# Patient Record
Sex: Female | Born: 1985 | Race: Black or African American | Hispanic: No | Marital: Single | Smoking: Never smoker
Health system: Southern US, Community
[De-identification: ages and names within clinical notes are randomized; demographics above are authoritative.]

## PROBLEM LIST (undated history)

## (undated) ENCOUNTER — Inpatient Hospital Stay (HOSPITAL_COMMUNITY): Payer: Self-pay

## (undated) DIAGNOSIS — R87619 Unspecified abnormal cytological findings in specimens from cervix uteri: Secondary | ICD-10-CM

## (undated) DIAGNOSIS — IMO0002 Reserved for concepts with insufficient information to code with codable children: Secondary | ICD-10-CM

## (undated) DIAGNOSIS — Z309 Encounter for contraceptive management, unspecified: Secondary | ICD-10-CM

## (undated) DIAGNOSIS — B9689 Other specified bacterial agents as the cause of diseases classified elsewhere: Secondary | ICD-10-CM

## (undated) DIAGNOSIS — O26899 Other specified pregnancy related conditions, unspecified trimester: Secondary | ICD-10-CM

## (undated) DIAGNOSIS — N898 Other specified noninflammatory disorders of vagina: Secondary | ICD-10-CM

## (undated) DIAGNOSIS — Z30432 Encounter for removal of intrauterine contraceptive device: Secondary | ICD-10-CM

## (undated) DIAGNOSIS — N760 Acute vaginitis: Secondary | ICD-10-CM

## (undated) DIAGNOSIS — R87629 Unspecified abnormal cytological findings in specimens from vagina: Secondary | ICD-10-CM

## (undated) DIAGNOSIS — R109 Unspecified abdominal pain: Principal | ICD-10-CM

## (undated) HISTORY — DX: Other specified bacterial agents as the cause of diseases classified elsewhere: N76.0

## (undated) HISTORY — DX: Reserved for concepts with insufficient information to code with codable children: IMO0002

## (undated) HISTORY — DX: Encounter for contraceptive management, unspecified: Z30.9

## (undated) HISTORY — DX: Other specified pregnancy related conditions, unspecified trimester: O26.899

## (undated) HISTORY — DX: Unspecified abnormal cytological findings in specimens from vagina: R87.629

## (undated) HISTORY — PX: NO PAST SURGERIES: SHX2092

## (undated) HISTORY — DX: Unspecified abdominal pain: R10.9

## (undated) HISTORY — DX: Other specified noninflammatory disorders of vagina: N89.8

## (undated) HISTORY — DX: Unspecified abnormal cytological findings in specimens from cervix uteri: R87.619

## (undated) HISTORY — DX: Other specified bacterial agents as the cause of diseases classified elsewhere: B96.89

## (undated) HISTORY — DX: Encounter for removal of intrauterine contraceptive device: Z30.432

---
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
## 2006-10-10 ENCOUNTER — Inpatient Hospital Stay (HOSPITAL_COMMUNITY): Admission: AD | Admit: 2006-10-10 | Discharge: 2006-10-13 | Payer: Self-pay | Admitting: Obstetrics and Gynecology

## 2008-04-27 ENCOUNTER — Other Ambulatory Visit: Admission: RE | Admit: 2008-04-27 | Discharge: 2008-04-27 | Payer: Self-pay | Admitting: Obstetrics and Gynecology

## 2008-05-02 ENCOUNTER — Emergency Department (HOSPITAL_COMMUNITY): Admission: EM | Admit: 2008-05-02 | Discharge: 2008-05-02 | Payer: Self-pay | Admitting: Emergency Medicine

## 2008-09-24 ENCOUNTER — Inpatient Hospital Stay (HOSPITAL_COMMUNITY): Admission: AD | Admit: 2008-09-24 | Discharge: 2008-09-24 | Payer: Self-pay | Admitting: Obstetrics & Gynecology

## 2008-10-20 ENCOUNTER — Inpatient Hospital Stay (HOSPITAL_COMMUNITY): Admission: AD | Admit: 2008-10-20 | Discharge: 2008-10-21 | Payer: Self-pay | Admitting: Obstetrics & Gynecology

## 2008-10-23 ENCOUNTER — Ambulatory Visit: Payer: Self-pay | Admitting: Advanced Practice Midwife

## 2008-10-23 ENCOUNTER — Inpatient Hospital Stay (HOSPITAL_COMMUNITY): Admission: AD | Admit: 2008-10-23 | Discharge: 2008-10-23 | Payer: Self-pay | Admitting: Obstetrics & Gynecology

## 2008-11-10 ENCOUNTER — Inpatient Hospital Stay (HOSPITAL_COMMUNITY): Admission: AD | Admit: 2008-11-10 | Discharge: 2008-11-12 | Payer: Self-pay | Admitting: Obstetrics & Gynecology

## 2008-11-10 ENCOUNTER — Ambulatory Visit: Payer: Self-pay | Admitting: Family

## 2009-09-06 ENCOUNTER — Other Ambulatory Visit: Admission: RE | Admit: 2009-09-06 | Discharge: 2009-09-06 | Payer: Self-pay | Admitting: Unknown Physician Specialty

## 2009-10-17 ENCOUNTER — Emergency Department (HOSPITAL_COMMUNITY): Admission: EM | Admit: 2009-10-17 | Discharge: 2009-10-17 | Payer: Self-pay | Admitting: Emergency Medicine

## 2010-04-12 IMAGING — US US OB TRANSVAGINAL
1 series · 14 of 25 positions shown · non-contrast
Comparison: none

OBSTETRICAL ULTRASOUND:
 This ultrasound exam was performed in the [HOSPITAL] Ultrasound Department.  The OB US report was generated in the AS system, and faxed to the ordering physician.  This report is also available in [REDACTED] PACS.

[Series 1: us ob transvaginal · 0.28mm/px · 14 of 25 slices shown]
[im 1/25]
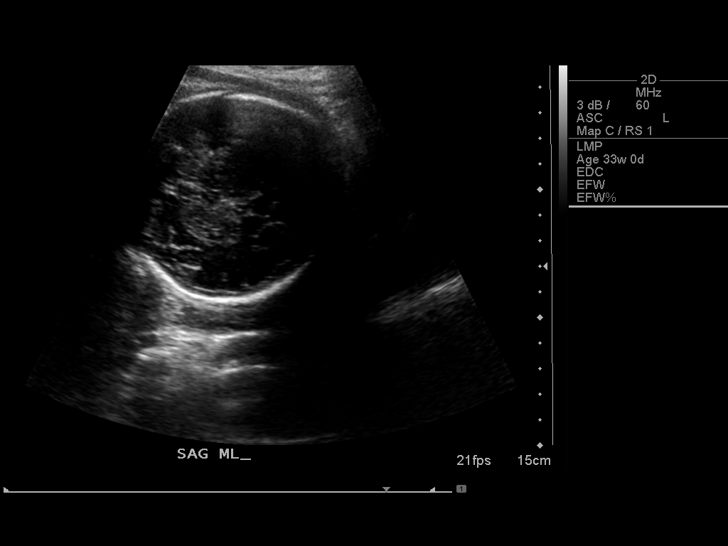
[im 3/25]
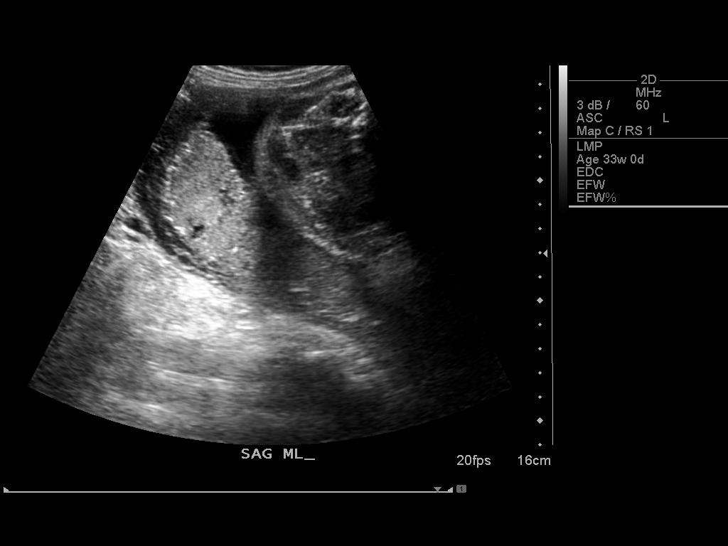
[im 5/25]
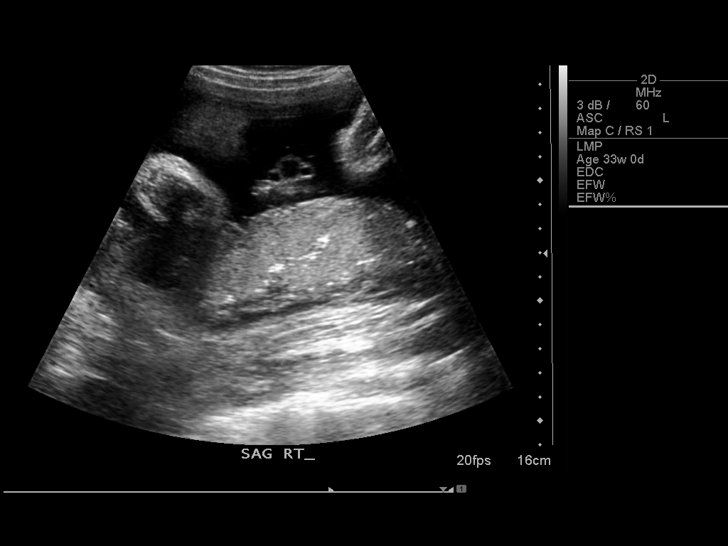
[im 7/25]
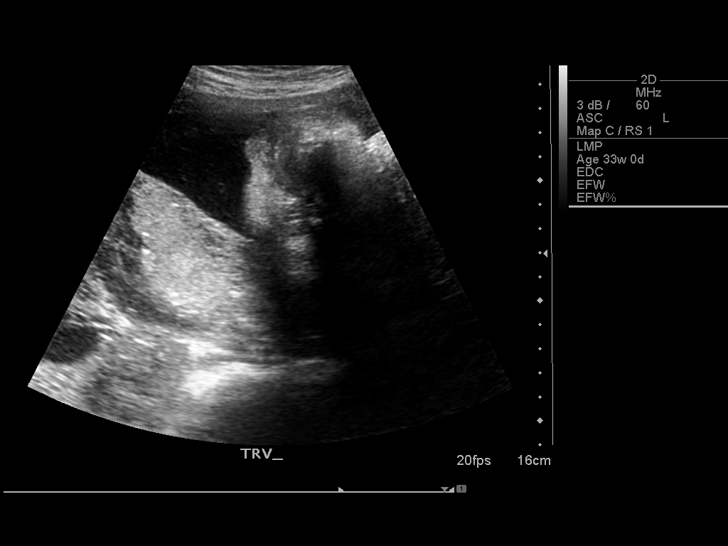
[im 9/25]
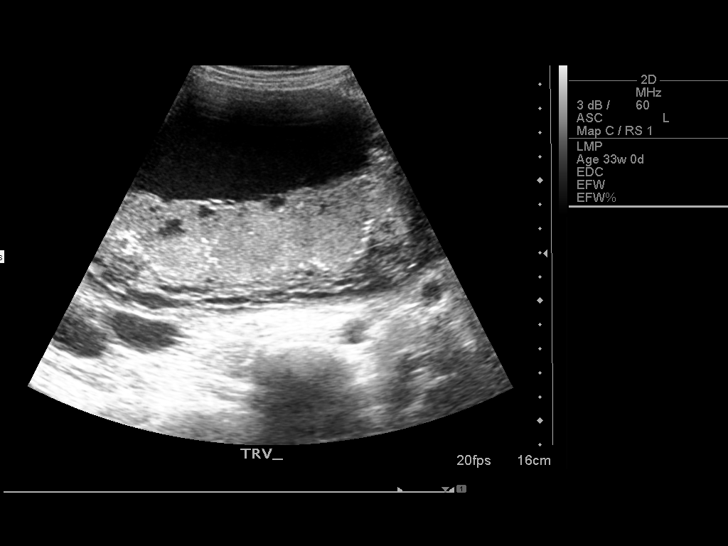
[im 10/25]
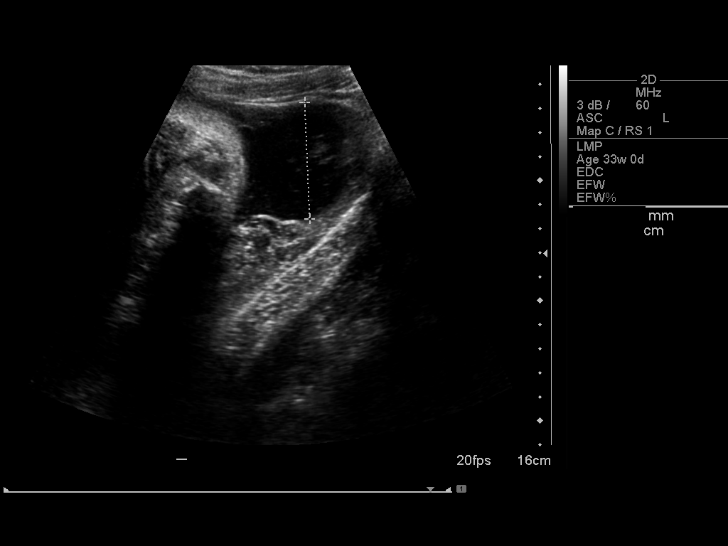
[im 12/25]
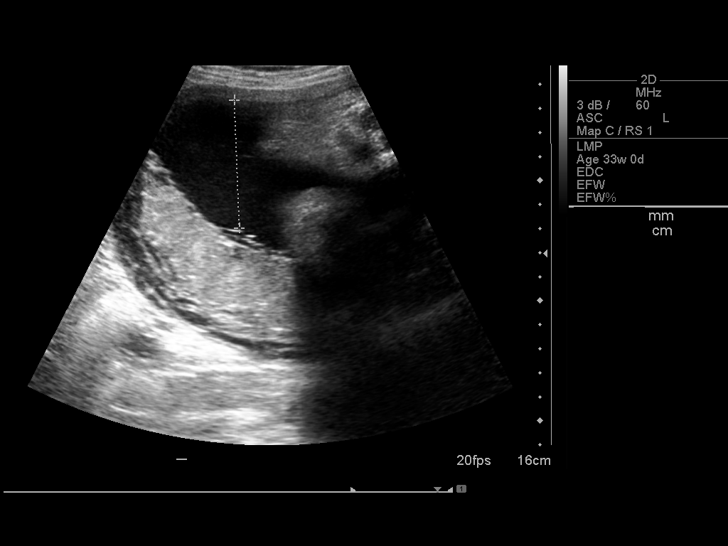
[im 14/25]
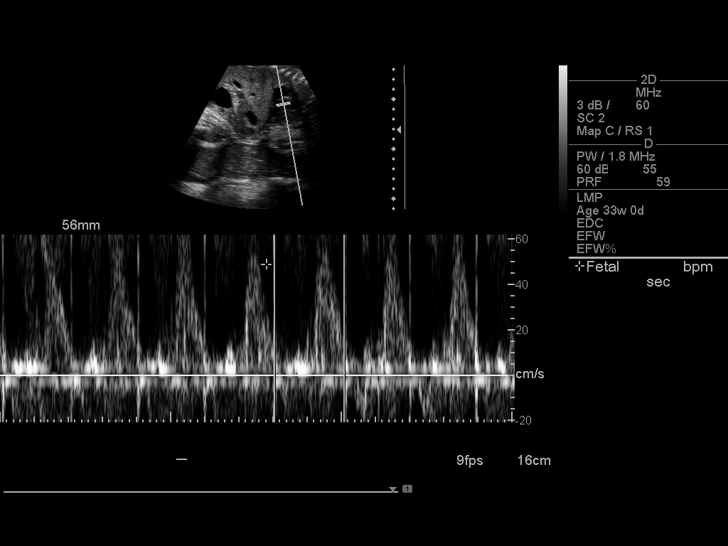
[im 16/25]
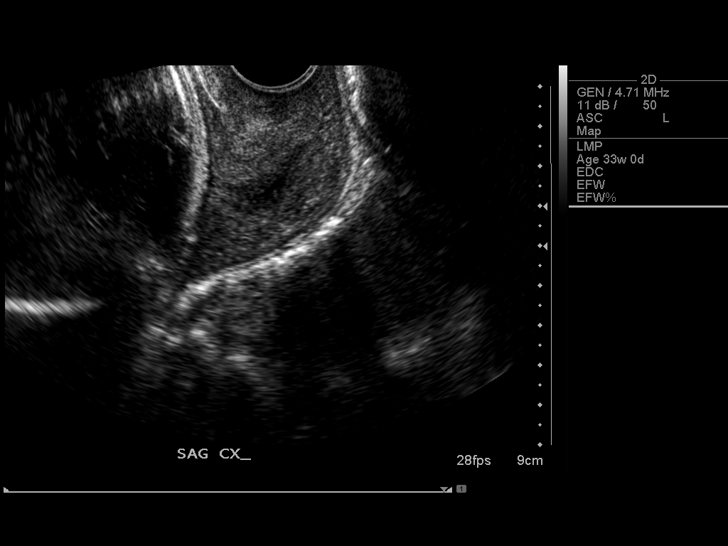
[im 17/25]
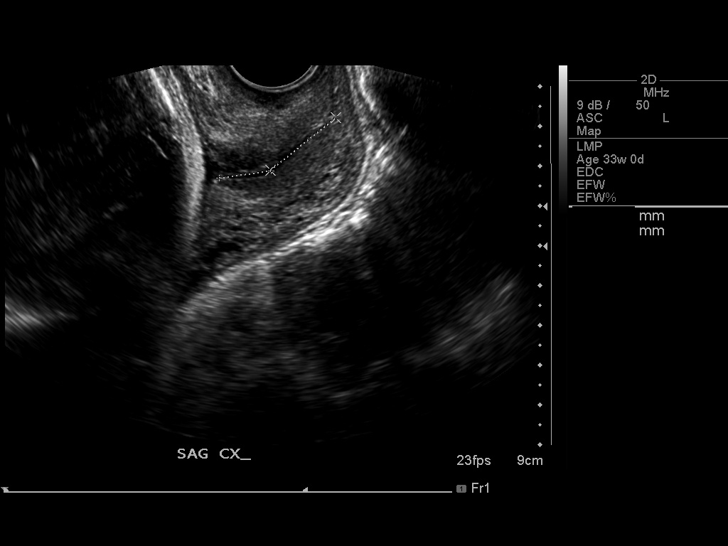
[im 19/25]
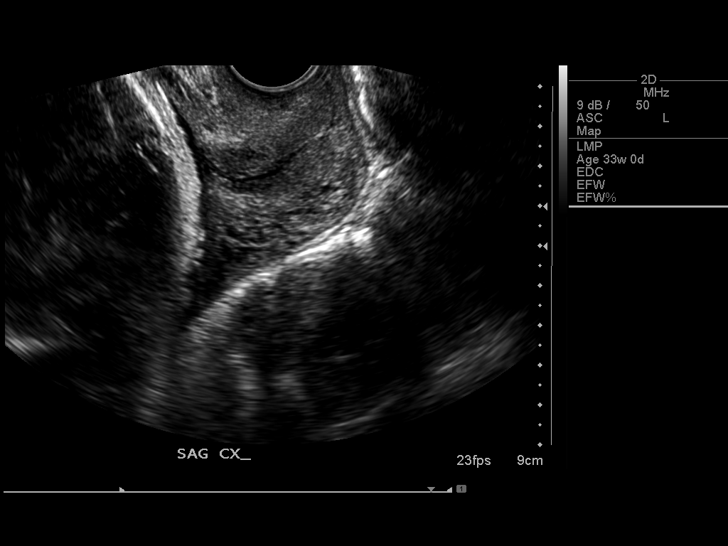
[im 21/25]
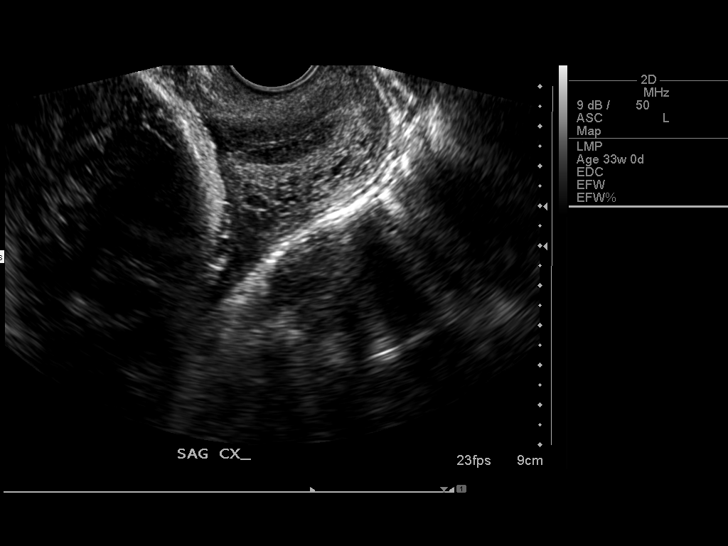
[im 23/25]
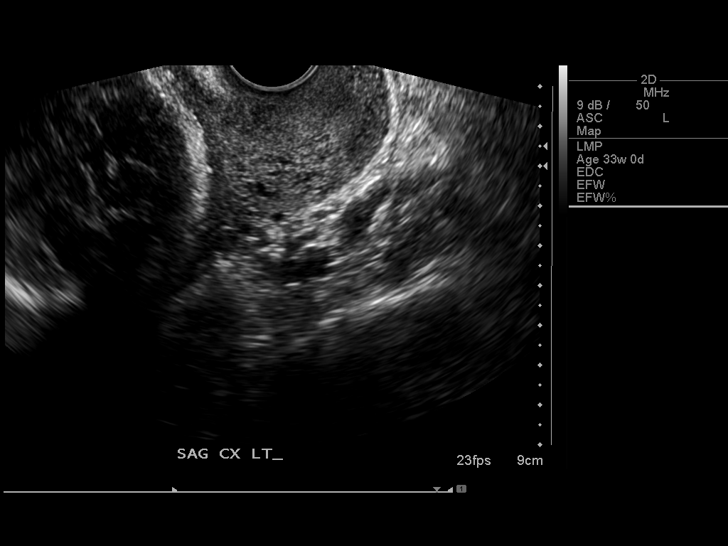
[im 25/25]
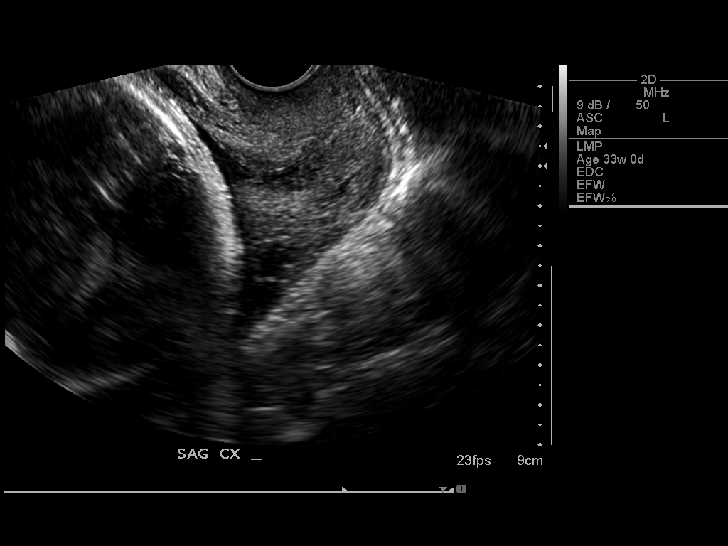

[14 of 25 positions shown; findings below may reference images not displayed]

IMPRESSION: See AS Obstetric US report.

## 2010-07-18 ENCOUNTER — Other Ambulatory Visit: Admission: RE | Admit: 2010-07-18 | Discharge: 2010-07-18 | Payer: Self-pay | Admitting: Obstetrics & Gynecology

## 2011-01-22 LAB — WET PREP, GENITAL: Trich, Wet Prep: NONE SEEN

## 2011-01-22 LAB — GC/CHLAMYDIA PROBE AMP, GENITAL: GC Probe Amp, Genital: NEGATIVE

## 2011-01-22 LAB — URINALYSIS, ROUTINE W REFLEX MICROSCOPIC
Bilirubin Urine: NEGATIVE
Protein, ur: NEGATIVE mg/dL
Urobilinogen, UA: 0.2 mg/dL (ref 0.0–1.0)

## 2011-02-05 LAB — RPR: RPR Ser Ql: NONREACTIVE

## 2011-02-05 LAB — CBC
MCHC: 32.5 g/dL (ref 30.0–36.0)
RDW: 16 % — ABNORMAL HIGH (ref 11.5–15.5)

## 2011-03-09 NOTE — Op Note (Signed)
NAME:  Sydney Alexander, Sydney Alexander                ACCOUNT NO.:  [id number]   MEDICAL RECORD NO.:  [id number]          PATIENT TYPE:  INP   LOCATION:  A402                          FACILITY:  [facility]   PHYSICIAN:  Tilda Burrow, M.D. DATE OF BIRTH:  1986/10/06   DATE OF PROCEDURE:  10/11/2006  DATE OF DISCHARGE:                               OPERATIVE REPORT   PROCEDURE:  Epidural catheter placement 7:30 a.m. on the 21st.   Continuous lumbar epidural catheter placed using loss of resistance  technique at L2-3 interspace on the first attempt.  The back was prepped  and draped in standard fashion, local anesthesia injected, Tuohy needle  placed using loss of resistance technique identifying the epidural space  at 4 cm (!) beneath the skin with 5 mL test dose of 1.5% Xylocaine with  epinephrine injected followed by catheter threading 3 cm into the  epidural space and then epidural initiated with a 7 mL initial dose at  14 mL/hr.  The patient had slow set up of the epidural with symmetric  analgesic effect.  Blood pressures remained normal.   Fetal heart rate through the night has shown baseline to be in the 120's  with adequate beat-to-beat variability.  There was initially some  concerns about early decelerations and of patient making steady progress  at 6 cm, 90 -1 vertex.      Tilda Burrow, M.D.  Electronically Signed     JVF/MEDQ  D:  10/11/2006  T:  10/11/2006  Job:  [id number]

## 2011-03-09 NOTE — Group Therapy Note (Signed)
NAME:  Sydney Alexander, Sydney Alexander NO.:  [id number]   MEDICAL RECORD NO.:  [id number]          PATIENT TYPE:  INP   LOCATION:  LDR1                          FACILITY:  [facility]   PHYSICIAN:  Tilda Burrow, M.D. DATE OF BIRTH:  10-Nov-1985   DATE OF PROCEDURE:  DATE OF DISCHARGE:                                 PROGRESS NOTE   Sydney Alexander progressed in labor basically off the Foley bulb stimulation.  She  was noted to be 5 cm dilated at approximately 0630 where she had  spontaneous rupture of membranes with clear fluid.  She did have a  prolonged variable after rupture, however no cord was palpated and it  responded to position changes and O2.  She received her epidural shortly  after that at approximately 7:15.  Within an hour's time she was noted  to be fully dilated at +3 station with an urge to push.  And she pushed  about three times and had a spontaneous vaginal delivery of a viable  female infant at 75.  The mouth and nose were suctioned on the  perineum and the body delivered without difficulty.  Weight is 7-pounds-  6-ounces, Apgar is 9 and 9.  20 units of Pitocin diluted in 1000 mL of  Lactated Ringer's was infused rapidly IV.  The placenta separated  spontaneously and delivered via controlled cord traction at 0845.  It  was inspected and appears to be intact with a three-vessel cord.  The  vagina was inspected and a second degree laceration was found.  It was  repaired under epidural anesthesia with a 2-0 Vicryl suture.  The  catheter was removed and the blue tip visualized as being intact.  Estimated blood loss 200 mL.      Jacklyn Shell, C.N.M.      Tilda Burrow, M.D.  Electronically Signed    FC/MEDQ  D:  10/11/2006  T:  10/12/2006  Job:  [id number]

## 2011-03-09 NOTE — H&P (Signed)
NAME:  GAILE, ALLMON NO.:  [id number]   MEDICAL RECORD NO.:  [id number]          PATIENT TYPE:  INP   LOCATION:  LDR1                          FACILITY:  [facility]   PHYSICIAN:  Lazaro Arms, M.D.   DATE OF BIRTH:  [birth year]   DATE OF ADMISSION:  DATE OF DISCHARGE:  LH                              HISTORY & PHYSICAL   Sydney Alexander is a 25 year old African-American female, gravida 1, para 0, with  estimated date of delivery of October 08, 2006, currently at 15 and  3/7ths weeks gestation, who is admitted to labor and delivery for  cervical ripening.  Her cervix is unfavorable.  It is soft, but it is  long, thick and closed and very posterior.  The fetal vertex is against  the cervix, but at about a -2 station.  As a result, she is admitted for  cervical ripening followed by Pitocin induction of labor.   PAST MEDICAL HISTORY:  Negative.  She did have an abnormal Pap smear  this pregnancy.  A colposcopy was normal.  Will repeat the Pap after  delivery.   PAST SURGICAL HISTORY:  Negative.   ALLERGIES:  None.   MEDICATIONS:  Prenatal vitamins.   Blood type is A positive.  Rubella is immune.  Hepatitis B is negative.  HIV is nonreactive.  Serology is nonreactive.  Pap smear is as above.  GC and Chlamydia are negative x2.  Group B strep was negative.  AFP was  normal.  Glucola was normal.  HSV-2 was negative.   IMPRESSION:  1. Intrauterine pregnancy at 40 and 3/7ths weeks gestation.  2. Unfavorable cervix.   PLAN:  The patient is admitted for cervical ripening and induction of  labor.  She understands the indications and will proceed.      Lazaro Arms, M.D.  Electronically Signed     LHE/MEDQ  D:  10/10/2006  T:  10/10/2006  Job:  [id number]

## 2011-07-19 LAB — URINE MICROSCOPIC-ADD ON

## 2011-07-19 LAB — URINALYSIS, ROUTINE W REFLEX MICROSCOPIC
Ketones, ur: NEGATIVE
Protein, ur: NEGATIVE
Urobilinogen, UA: 0.2

## 2011-07-19 LAB — CBC
MCHC: 34.1
MCV: 86.9
Platelets: 214

## 2011-07-19 LAB — WET PREP, GENITAL
Trich, Wet Prep: NONE SEEN
Yeast Wet Prep HPF POC: NONE SEEN

## 2011-07-19 LAB — DIFFERENTIAL
Basophils Relative: 1
Eosinophils Absolute: 0.1
Monocytes Relative: 11
Neutrophils Relative %: 50

## 2011-07-19 LAB — GC/CHLAMYDIA PROBE AMP, GENITAL: GC Probe Amp, Genital: NEGATIVE

## 2011-07-27 LAB — URINALYSIS, ROUTINE W REFLEX MICROSCOPIC
Bilirubin Urine: NEGATIVE
Glucose, UA: NEGATIVE mg/dL
Hgb urine dipstick: NEGATIVE
Ketones, ur: NEGATIVE mg/dL
Nitrite: NEGATIVE
Protein, ur: NEGATIVE mg/dL
Specific Gravity, Urine: 1.01 (ref 1.005–1.030)
Urobilinogen, UA: 0.2 mg/dL (ref 0.0–1.0)
pH: 6 (ref 5.0–8.0)
pH: 7 (ref 5.0–8.0)

## 2011-07-27 LAB — WET PREP, GENITAL
Trich, Wet Prep: NONE SEEN
Yeast Wet Prep HPF POC: NONE SEEN

## 2011-07-27 LAB — URINE MICROSCOPIC-ADD ON

## 2011-08-17 ENCOUNTER — Other Ambulatory Visit (HOSPITAL_COMMUNITY)
Admission: RE | Admit: 2011-08-17 | Discharge: 2011-08-17 | Disposition: A | Discharge status: Home / Self Care | Payer: Medicaid Other | Source: Ambulatory Visit | Attending: Obstetrics and Gynecology | Admitting: Obstetrics and Gynecology

## 2011-08-17 DIAGNOSIS — Z113 Encounter for screening for infections with a predominantly sexual mode of transmission: Secondary | ICD-10-CM | POA: Insufficient documentation

## 2011-08-17 DIAGNOSIS — R87810 Cervical high risk human papillomavirus (HPV) DNA test positive: Secondary | ICD-10-CM | POA: Insufficient documentation

## 2011-08-17 DIAGNOSIS — Z01419 Encounter for gynecological examination (general) (routine) without abnormal findings: Secondary | ICD-10-CM | POA: Insufficient documentation

## 2011-08-20 ENCOUNTER — Other Ambulatory Visit: Payer: Self-pay | Admitting: Adult Health

## 2013-02-09 ENCOUNTER — Encounter: Payer: Self-pay | Admitting: Obstetrics & Gynecology

## 2013-02-25 ENCOUNTER — Encounter: Payer: Self-pay | Admitting: *Deleted

## 2013-02-27 ENCOUNTER — Encounter: Payer: Self-pay | Admitting: *Deleted

## 2013-03-02 ENCOUNTER — Encounter: Payer: Self-pay | Admitting: *Deleted

## 2013-03-02 ENCOUNTER — Encounter: Payer: Self-pay | Admitting: Obstetrics & Gynecology

## 2014-05-03 ENCOUNTER — Ambulatory Visit: Payer: Self-pay | Admitting: Women's Health

## 2014-08-23 ENCOUNTER — Encounter: Payer: Self-pay | Admitting: *Deleted

## 2015-02-28 ENCOUNTER — Ambulatory Visit: Payer: Self-pay | Admitting: Adult Health

## 2015-03-10 ENCOUNTER — Ambulatory Visit (INDEPENDENT_AMBULATORY_CARE_PROVIDER_SITE_OTHER): Payer: Medicaid Other | Admitting: Adult Health

## 2015-03-10 ENCOUNTER — Encounter: Payer: Self-pay | Admitting: Adult Health

## 2015-03-10 VITALS — BP 120/72 | HR 80 | Ht 60.0 in | Wt 165.0 lb

## 2015-03-10 DIAGNOSIS — Z30432 Encounter for removal of intrauterine contraceptive device: Secondary | ICD-10-CM

## 2015-03-10 DIAGNOSIS — Z309 Encounter for contraceptive management, unspecified: Secondary | ICD-10-CM

## 2015-03-10 DIAGNOSIS — Z30011 Encounter for initial prescription of contraceptive pills: Secondary | ICD-10-CM

## 2015-03-10 DIAGNOSIS — Z3202 Encounter for pregnancy test, result negative: Secondary | ICD-10-CM | POA: Diagnosis not present

## 2015-03-10 HISTORY — DX: Encounter for contraceptive management, unspecified: Z30.9

## 2015-03-10 HISTORY — DX: Encounter for removal of intrauterine contraceptive device: Z30.432

## 2015-03-10 LAB — POCT URINE PREGNANCY: Preg Test, Ur: NEGATIVE

## 2015-03-10 MED ORDER — NORETHIN ACE-ETH ESTRAD-FE 1-20 MG-MCG(24) PO CHEW: 1.0000 | CHEWABLE_TABLET | Freq: Every day | ORAL | Status: DC

## 2015-03-10 MED ORDER — DOXYCYCLINE HYCLATE 100 MG PO TABS: 100.0000 mg | ORAL_TABLET | Freq: Two times a day (BID) | ORAL | Status: DC

## 2015-03-10 NOTE — Progress Notes (Signed)
Subjective:     Patient ID: Sydney Alexander, female   DOB: 11/26/1985, 29 y.o.   MRN: [id number]  HPI Sydney NiemannJaime is a 29 year old black female in for IUD removal,it is past due for removal.She has regular periods and can not feel the strings.  Review of Systems For IUD removal, Patient denies any headaches, hearing loss, fatigue, blurred vision, shortness of breath, chest pain, abdominal pain, problems with bowel movements, urination, or intercourse. No joint pain or mood swings. Reviewed past medical,surgical, social and family history. Reviewed medications and allergies.     Objective:   Physical Exam BP 120/72 mmHg  Pulse 80  Ht 5' (1.524 m)  Wt 165 lb (74.844 kg)  BMI 32.22 kg/m2  LMP 03/07/2015 UPT negative, consent signed, speculum inserted, +period blood, cervix visualized, no strings seen, tried to tease them out with EC brush, IUD hook and forceps, unable to find, Dr Sydney Alexander in and he was also unable to locate, pt moved to room 2 and US was used to find IUD then para cervical block performed by Dr Sydney Alexander and IUD removed with IUD hook, pt tolerated well.She wants to start OCs and has no contraindication for COC.    Assessment:    IUD removal  Contraceptive management    Plan:     Rx doxycycline 100 mg #6 take 1 bid x 3 days Rx minastrin disp # 1 pack take 1 daily with 11 refills, 1 pack given lot [id number] A exp 10/16,start today No sex for 2 weeks then use condoms x 1 pack  Return in 3 months for pap and physical

## 2015-03-10 NOTE — Patient Instructions (Signed)
No  Sex for 2 weeks then use condoms x 1 pack of pills Start minastrin today, take same time every day  Return in 3 months for pap and physical  Take doxy  Use 3-4 advil if needed for cramps increase fluids

## 2015-06-10 ENCOUNTER — Other Ambulatory Visit: Payer: Medicaid Other | Admitting: Adult Health

## 2015-06-10 ENCOUNTER — Encounter: Payer: Self-pay | Admitting: Adult Health

## 2015-10-23 NOTE — L&D Delivery Note (Signed)
30 y.o. G3P2002 at 5730w3d delivered a viable female infant in cephalic, ROA position.  nuchal cord x 1 loose, easily reduced. left anterior shoulder delivered with ease. 60 sec delayed cord clamping. Cord clamped x2 and cut. Placenta delivered spontaneously intact, with 3VC. Fundus firm on exam with massage and pitocin. Good hemostasis noted.  Laceration: 1st degree midline Suture: 3-0 Vicryl EBL: 200 Good hemostasis noted.  Mom and baby recovering in LDR.    Apgars: 9,9 Weight: pending, skin to skin, see delivery summary  Cord blood obtained: Yes   Sydney Graybeal I, DO PGY-3 09/25/2016, 6:54 PM

## 2015-12-27 ENCOUNTER — Emergency Department (HOSPITAL_COMMUNITY)
Admission: EM | Admit: 2015-12-27 | Discharge: 2015-12-27 | Disposition: A | Discharge status: Home / Self Care | Payer: Medicaid Other | Attending: Emergency Medicine | Admitting: Emergency Medicine

## 2015-12-27 ENCOUNTER — Encounter (HOSPITAL_COMMUNITY): Payer: Self-pay | Admitting: Cardiology

## 2015-12-27 DIAGNOSIS — N760 Acute vaginitis: Secondary | ICD-10-CM | POA: Insufficient documentation

## 2015-12-27 DIAGNOSIS — B9689 Other specified bacterial agents as the cause of diseases classified elsewhere: Secondary | ICD-10-CM

## 2015-12-27 DIAGNOSIS — Z3202 Encounter for pregnancy test, result negative: Secondary | ICD-10-CM | POA: Insufficient documentation

## 2015-12-27 LAB — POC URINE PREG, ED: Preg Test, Ur: NEGATIVE

## 2015-12-27 LAB — WET PREP, GENITAL
SPERM: NONE SEEN
TRICH WET PREP: NONE SEEN
Yeast Wet Prep HPF POC: NONE SEEN

## 2015-12-27 MED ORDER — AZITHROMYCIN 250 MG PO TABS
1000.0000 mg | ORAL_TABLET | Freq: Once | ORAL | Status: DC
  Filled 2015-12-27: qty 4

## 2015-12-27 MED ORDER — STERILE WATER FOR INJECTION IJ SOLN
INTRAMUSCULAR | Status: AC
  Filled 2015-12-27: qty 10

## 2015-12-27 MED ORDER — METRONIDAZOLE 500 MG PO TABS
500.0000 mg | ORAL_TABLET | Freq: Two times a day (BID) | ORAL | Status: DC
Start: 2015-12-27 — End: 2016-03-26

## 2015-12-27 MED ORDER — CEFTRIAXONE SODIUM 250 MG IJ SOLR
250.0000 mg | Freq: Once | INTRAMUSCULAR | Status: DC
  Filled 2015-12-27: qty 250

## 2015-12-27 NOTE — ED Notes (Signed)
Reports this morning she woke with vaginal pain and itching. States she recently took a cruise and had noticed some discharge.

## 2015-12-27 NOTE — ED Notes (Signed)
Pelvic cart set up at bedside  

## 2015-12-27 NOTE — Discharge Instructions (Signed)
Return here as needed. Follow up with your GYN °

## 2015-12-27 NOTE — ED Notes (Signed)
Pt is in stable condition upon d/c and ambulates from ED. 

## 2015-12-28 LAB — GC/CHLAMYDIA PROBE AMP (~~LOC~~) NOT AT ARMC
Chlamydia: NEGATIVE
Neisseria Gonorrhea: NEGATIVE

## 2015-12-29 NOTE — ED Provider Notes (Signed)
CSN: [id number]     Arrival date & time 12/27/15  [id number] History   First MD Initiated Contact with Patient 12/27/15 1006     Chief Complaint  Patient presents with  . Vaginal Pain     (Consider location/radiation/quality/duration/timing/severity/associated sxs/prior Treatment) HPI Patient presents to the emergency department with vaginal pain and itching.  She states that this started this morning.  She states she has had previous history of BV.  She states she has had some discharge.  She states she recently went on a cruise and noticed some itching at that time.  The patient states that nothing seemed to make the condition better or worse.  She did not take any medications at home.  The patient denies vaginal bleeding, chest pain, shortness breath, fever, weakness, abdominal pain, nausea, vomiting, diarrhea, dysuria, incontinence, or syncope.  The patient states that she has been sexually active without protection recently Past Medical History  Diagnosis Date  . Abnormal pap   . BV (bacterial vaginosis)   . Vaginal Pap smear, abnormal   . Encounter for IUD removal 03/10/2015  . Contraceptive management 03/10/2015   Past Surgical History  Procedure Laterality Date  . No past surgeries     Family History  Problem Relation Age of Onset  . Diabetes Other     lots of aunts and uncles on both sides  . Cancer Maternal Grandmother   . Thyroid disease Mother   . Cancer Paternal Aunt    Social History  Substance Use Topics  . Smoking status: Never Smoker   . Smokeless tobacco: Never Used  . Alcohol Use: No   OB History    Gravida Para Term Preterm AB TAB SAB Ectopic Multiple Living   3 3 2       2      Review of Systems  All other systems negative except as documented in the HPI. All pertinent positives and negatives as reviewed in the HPI.  Allergies  Review of patient's allergies indicates no known allergies.  Home Medications   Prior to Admission medications   Medication Sig  Start Date End Date Taking? Authorizing Provider  diphenhydrAMINE (BENADRYL) 25 MG tablet Take 25 mg by mouth every 6 (six) hours as needed for allergies.   Yes Historical Provider, MD  metroNIDAZOLE (FLAGYL) 500 MG tablet Take 1 tablet (500 mg total) by mouth 2 (two) times daily. 12/27/15   Zahir Eisenhour, PA-C   BP 102/62 mmHg  Pulse 59  Temp(Src) 99.1 F (37.3 C) (Oral)  Resp 20  Wt 79.011 kg  SpO2 98%  LMP 12/12/2015 Physical Exam  Constitutional: She is oriented to person, place, and time. She appears well-developed and well-nourished. No distress.  HENT:  Head: Normocephalic and atraumatic.  Mouth/Throat: Oropharynx is clear and moist.  Eyes: Pupils are equal, round, and reactive to light.  Neck: Normal range of motion. Neck supple.  Cardiovascular: Normal rate, regular rhythm and normal heart sounds.  Exam reveals no gallop and no friction rub.   No murmur heard. Pulmonary/Chest: Effort normal and breath sounds normal. No respiratory distress. She has no wheezes.  Abdominal: Soft. Bowel sounds are normal. She exhibits no distension. There is no tenderness.  Genitourinary: Cervix exhibits discharge. Cervix exhibits no motion tenderness. Right adnexum displays no mass and no tenderness. Left adnexum displays no mass and no tenderness. Vaginal discharge found.  Neurological: She is alert and oriented to person, place, and time. She exhibits normal muscle tone. Coordination normal.  Skin: Skin  is warm and dry. No rash noted. No erythema.  Psychiatric: She has a normal mood and affect. Her behavior is normal.  Nursing note and vitals reviewed.   ED Course  Procedures (including critical care time) Labs Review Labs Reviewed  WET PREP, GENITAL - Abnormal; Notable for the following:    Clue Cells Wet Prep HPF POC PRESENT (*)    WBC, Wet Prep HPF POC MANY (*)    All other components within normal limits  POC URINE PREG, ED  GC/CHLAMYDIA PROBE AMP ([place]) NOT AT [facility]     Imaging Review No results found. I have personally reviewed and evaluated these images and lab results as part of my medical decision-making.    Patient was offered treatment for STDs. The patient states she will wait for culture results.  Patient agrees the plan and all questions were answered.  She did not want treatment for BV based on the fact that she has clue cells and many whites  Charlestine Night, PA-C 01/02/16 1702  Charlestine Night, PA-C 01/02/16 1702  Mancel Bale, MD 01/03/16 2326

## 2016-02-20 ENCOUNTER — Other Ambulatory Visit: Payer: Self-pay | Admitting: Obstetrics & Gynecology

## 2016-02-20 DIAGNOSIS — O3680X0 Pregnancy with inconclusive fetal viability, not applicable or unspecified: Secondary | ICD-10-CM

## 2016-02-23 ENCOUNTER — Ambulatory Visit (INDEPENDENT_AMBULATORY_CARE_PROVIDER_SITE_OTHER): Payer: Medicaid Other

## 2016-02-23 DIAGNOSIS — Z3A08 8 weeks gestation of pregnancy: Secondary | ICD-10-CM

## 2016-02-23 DIAGNOSIS — O3680X0 Pregnancy with inconclusive fetal viability, not applicable or unspecified: Secondary | ICD-10-CM | POA: Diagnosis not present

## 2016-02-23 NOTE — Progress Notes (Signed)
US 7+5 wks,single IUP w/ys,pos fht 153 bpm,normal ov's bilat,crl 13.785mm

## 2016-03-05 ENCOUNTER — Encounter: Payer: Self-pay | Admitting: Women's Health

## 2016-03-05 ENCOUNTER — Other Ambulatory Visit (HOSPITAL_COMMUNITY)
Admission: RE | Admit: 2016-03-05 | Discharge: 2016-03-05 | Disposition: A | Discharge status: Home / Self Care | Payer: Medicaid Other | Source: Ambulatory Visit | Attending: Obstetrics & Gynecology | Admitting: Obstetrics & Gynecology

## 2016-03-05 ENCOUNTER — Ambulatory Visit (INDEPENDENT_AMBULATORY_CARE_PROVIDER_SITE_OTHER): Payer: Medicaid Other | Admitting: Women's Health

## 2016-03-05 VITALS — BP 108/64 | HR 90 | Wt 173.5 lb

## 2016-03-05 DIAGNOSIS — Z0283 Encounter for blood-alcohol and blood-drug test: Secondary | ICD-10-CM

## 2016-03-05 DIAGNOSIS — Z113 Encounter for screening for infections with a predominantly sexual mode of transmission: Secondary | ICD-10-CM | POA: Diagnosis present

## 2016-03-05 DIAGNOSIS — Z01411 Encounter for gynecological examination (general) (routine) with abnormal findings: Secondary | ICD-10-CM | POA: Diagnosis present

## 2016-03-05 DIAGNOSIS — Z3490 Encounter for supervision of normal pregnancy, unspecified, unspecified trimester: Secondary | ICD-10-CM | POA: Insufficient documentation

## 2016-03-05 DIAGNOSIS — Z1151 Encounter for screening for human papillomavirus (HPV): Secondary | ICD-10-CM | POA: Insufficient documentation

## 2016-03-05 DIAGNOSIS — Z3491 Encounter for supervision of normal pregnancy, unspecified, first trimester: Secondary | ICD-10-CM | POA: Diagnosis not present

## 2016-03-05 DIAGNOSIS — Z1389 Encounter for screening for other disorder: Secondary | ICD-10-CM

## 2016-03-05 DIAGNOSIS — Z369 Encounter for antenatal screening, unspecified: Secondary | ICD-10-CM

## 2016-03-05 DIAGNOSIS — Z124 Encounter for screening for malignant neoplasm of cervix: Secondary | ICD-10-CM

## 2016-03-05 DIAGNOSIS — Z331 Pregnant state, incidental: Secondary | ICD-10-CM

## 2016-03-05 DIAGNOSIS — Z3682 Encounter for antenatal screening for nuchal translucency: Secondary | ICD-10-CM

## 2016-03-05 MED ORDER — DOXYLAMINE-PYRIDOXINE 10-10 MG PO TBEC: DELAYED_RELEASE_TABLET | ORAL | Status: DC

## 2016-03-05 MED ORDER — PRENATAL PLUS 27-1 MG PO TABS: 1.0000 | ORAL_TABLET | Freq: Every day | ORAL | Status: DC

## 2016-03-05 NOTE — Addendum Note (Signed)
Addended by: Shawna ClampBOOKER, Farheen Pfahler R on: 03/05/2016 12:28 PM   Modules accepted: Orders

## 2016-03-05 NOTE — Progress Notes (Signed)
  Subjective:  Sydney Alexander is a 30 y.o. 673P2002 African American female at 10034w2d by 7wk u/s, being seen today for her first obstetrical visit.  Her obstetrical history is significant for term svb x 2, 2nd pregnancy had to be on bedrest d/t bleeding- no ptl.  Pregnancy history fully reviewed.  Patient reports mainly nausea- occ vomiting, requests meds. Clitoris very sensitive. Some cramping. Denies vb,  uti s/s, abnormal/malodorous vag d/c, or vulvovaginal itching/irritation.  BP 108/64 mmHg  Pulse 90  Wt 173 lb 8 oz (78.699 kg)  LMP 12/13/2015 (Approximate)  HISTORY: OB History  Gravida Para Term Preterm AB SAB TAB Ectopic Multiple Living  3 2 2       2     # Outcome Date GA Lbr Len/2nd Weight Sex Delivery Anes PTL Lv  3 Current           2 Term 11/10/08 8249w0d  8 lb (3.629 kg) F Vag-Spont   Y  1 Term 10/11/06 2730w0d  7 lb 6 oz (3.345 kg) F Vag-Spont   Y     Past Medical History  Diagnosis Date  . Abnormal pap   . BV (bacterial vaginosis)   . Vaginal Pap smear, abnormal   . Encounter for IUD removal 03/10/2015  . Contraceptive management 03/10/2015   Past Surgical History  Procedure Laterality Date  . No past surgeries     Family History  Problem Relation Age of Onset  . Diabetes Other     lots of aunts and uncles on both sides  . Cancer Maternal Grandmother   . Thyroid disease Mother   . Cancer Paternal Aunt     Exam   System:     General: Well developed & nourished, no acute distress   Skin: Warm & dry, normal coloration and turgor, no rashes   Neurologic: Alert & oriented, normal mood   Cardiovascular: Regular rate & rhythm   Respiratory: Effort & rate normal, LCTAB, acyanotic   Abdomen: Soft, non tender   Extremities: normal strength, tone   Pelvic Exam:    Perineum: Normal perineum   Vulva: Normal, no lesions   Vagina:  Normal mucosa, normal discharge   Cervix: Normal, bulbous, appears closed   Uterus: Normal size/shape/contour for GA   Thin prep pap  smear obtained w/ reflex high risk HPV cotesting FHR: + via informal u/s   Assessment:   Pregnancy: [id number] Patient Active Problem List   Diagnosis Date Noted  . Supervision of normal pregnancy 03/05/2016    Priority: High    7134w2d G3P2002 New OB visit N/V of pregnancy  Plan:  Initial labs drawn Continue prenatal vitamins Problem list reviewed and updated Reviewed n/v relief measures and warning s/s to report Rx diclegis, prior auth approved through [employer] today Reviewed recommended weight gain based on pre-gravid BMI Encouraged well-balanced diet Genetic Screening discussed Integrated Screen: requested Cystic fibrosis screening discussed requested Ultrasound discussed; fetal survey: requested Follow up in 3 weeks for 1st it/nt and visit [facility] completed  Marge DuncansBooker, Camauri Fleece Randall CNM, [facility]NP-BC 03/05/2016 12:18 PM

## 2016-03-05 NOTE — Patient Instructions (Signed)

## 2016-03-07 LAB — URINE CULTURE

## 2016-03-07 LAB — CYTOLOGY - PAP

## 2016-03-13 ENCOUNTER — Telehealth: Payer: Self-pay | Admitting: Women's Health

## 2016-03-13 DIAGNOSIS — R87610 Atypical squamous cells of undetermined significance on cytologic smear of cervix (ASC-US): Secondary | ICD-10-CM

## 2016-03-13 DIAGNOSIS — R87810 Cervical high risk human papillomavirus (HPV) DNA test positive: Principal | ICD-10-CM

## 2016-03-13 NOTE — Telephone Encounter (Signed)
Called pt, notified of abnormal pap w/ +HRHPV, needs colpo >14wks.  Cheral MarkerKimberly R. Holiday Mcmenamin, CNM, [facility]NP-BC 03/13/2016 1:55 PM

## 2016-03-14 LAB — PMP SCREEN PROFILE (10S), URINE
AMPHETAMINE SCRN UR: NEGATIVE ng/mL
Barbiturate Screen, Ur: NEGATIVE ng/mL
Benzodiazepine Screen, Urine: NEGATIVE ng/mL
COCAINE(METAB.) SCREEN, URINE: NEGATIVE ng/mL
CREATININE(CRT), U: 119.9 mg/dL (ref 20.0–300.0)
Cannabinoids Ur Ql Scn: NEGATIVE ng/mL
METHADONE SCREEN, URINE: NEGATIVE ng/mL
Opiate Scrn, Ur: NEGATIVE ng/mL
Oxycodone+Oxymorphone Ur Ql Scn: NEGATIVE ng/mL
PCP SCRN UR: NEGATIVE ng/mL
PH UR, DRUG SCRN: 7 (ref 4.5–8.9)
PROPOXYPHENE SCREEN: NEGATIVE ng/mL

## 2016-03-14 LAB — RUBELLA SCREEN: Rubella Antibodies, IGG: 1.17 {index}

## 2016-03-14 LAB — HEPATITIS B SURFACE ANTIGEN: Hepatitis B Surface Ag: NEGATIVE

## 2016-03-14 LAB — CBC
HEMOGLOBIN: 13.5 g/dL (ref 11.1–15.9)
Hematocrit: 39.3 % (ref 34.0–46.6)
MCH: 30.4 pg (ref 26.6–33.0)
MCHC: 34.4 g/dL (ref 31.5–35.7)
MCV: 89 fL (ref 79–97)
PLATELETS: 198 10*3/uL (ref 150–379)
RBC: 4.44 x10E6/uL (ref 3.77–5.28)
RDW: 13.5 % (ref 12.3–15.4)
WBC: 5.4 10*3/uL (ref 3.4–10.8)

## 2016-03-14 LAB — HIV ANTIBODY (ROUTINE TESTING W REFLEX): HIV SCREEN 4TH GENERATION: NONREACTIVE

## 2016-03-14 LAB — URINALYSIS, ROUTINE W REFLEX MICROSCOPIC
Bilirubin, UA: NEGATIVE
Glucose, UA: NEGATIVE
Ketones, UA: NEGATIVE
Leukocytes, UA: NEGATIVE
Nitrite, UA: NEGATIVE
Protein, UA: NEGATIVE
RBC, UA: NEGATIVE
Specific Gravity, UA: 1.019 (ref 1.005–1.030)
Urobilinogen, Ur: 0.2 mg/dL (ref 0.2–1.0)
pH, UA: 7 (ref 5.0–7.5)

## 2016-03-14 LAB — CYSTIC FIBROSIS MUTATION 97: Interpretation: NOT DETECTED

## 2016-03-14 LAB — SICKLE CELL SCREEN: Sickle Cell Screen: NEGATIVE

## 2016-03-14 LAB — ABO/RH: RH TYPE: POSITIVE

## 2016-03-14 LAB — ANTIBODY SCREEN: Antibody Screen: NEGATIVE

## 2016-03-14 LAB — SYPHILIS: RPR W/REFLEX TO RPR TITER AND TREPONEMAL ANTIBODIES, TRADITIONAL SCREENING AND DIAGNOSIS ALGORITHM: RPR Ser Ql: NONREACTIVE

## 2016-03-14 LAB — VARICELLA ZOSTER ANTIBODY, IGG: Varicella zoster IgG: 521 {index}

## 2016-03-26 ENCOUNTER — Ambulatory Visit (INDEPENDENT_AMBULATORY_CARE_PROVIDER_SITE_OTHER): Payer: Medicaid Other

## 2016-03-26 ENCOUNTER — Ambulatory Visit (INDEPENDENT_AMBULATORY_CARE_PROVIDER_SITE_OTHER): Payer: Medicaid Other | Admitting: Adult Health

## 2016-03-26 ENCOUNTER — Encounter: Payer: Self-pay | Admitting: Adult Health

## 2016-03-26 VITALS — BP 104/78 | HR 82 | Wt 173.5 lb

## 2016-03-26 DIAGNOSIS — Z36 Encounter for antenatal screening of mother: Secondary | ICD-10-CM | POA: Diagnosis not present

## 2016-03-26 DIAGNOSIS — Z331 Pregnant state, incidental: Secondary | ICD-10-CM

## 2016-03-26 DIAGNOSIS — A499 Bacterial infection, unspecified: Secondary | ICD-10-CM

## 2016-03-26 DIAGNOSIS — Z3682 Encounter for antenatal screening for nuchal translucency: Secondary | ICD-10-CM

## 2016-03-26 DIAGNOSIS — N898 Other specified noninflammatory disorders of vagina: Secondary | ICD-10-CM | POA: Insufficient documentation

## 2016-03-26 DIAGNOSIS — R109 Unspecified abdominal pain: Secondary | ICD-10-CM

## 2016-03-26 DIAGNOSIS — Z369 Encounter for antenatal screening, unspecified: Secondary | ICD-10-CM

## 2016-03-26 DIAGNOSIS — O26899 Other specified pregnancy related conditions, unspecified trimester: Secondary | ICD-10-CM

## 2016-03-26 DIAGNOSIS — O9989 Other specified diseases and conditions complicating pregnancy, childbirth and the puerperium: Secondary | ICD-10-CM

## 2016-03-26 DIAGNOSIS — B9689 Other specified bacterial agents as the cause of diseases classified elsewhere: Secondary | ICD-10-CM | POA: Insufficient documentation

## 2016-03-26 DIAGNOSIS — Z3491 Encounter for supervision of normal pregnancy, unspecified, first trimester: Secondary | ICD-10-CM

## 2016-03-26 DIAGNOSIS — Z1389 Encounter for screening for other disorder: Secondary | ICD-10-CM

## 2016-03-26 DIAGNOSIS — N760 Acute vaginitis: Secondary | ICD-10-CM

## 2016-03-26 HISTORY — DX: Other specified pregnancy related conditions, unspecified trimester: O26.899

## 2016-03-26 HISTORY — DX: Other specified noninflammatory disorders of vagina: N89.8

## 2016-03-26 LAB — POCT URINALYSIS DIPSTICK
Blood, UA: NEGATIVE
Glucose, UA: NEGATIVE
Ketones, UA: NEGATIVE
NITRITE UA: NEGATIVE
PROTEIN UA: NEGATIVE

## 2016-03-26 MED ORDER — METRONIDAZOLE 500 MG PO TABS: 500.0000 mg | ORAL_TABLET | Freq: Two times a day (BID) | ORAL | Status: DC

## 2016-03-26 NOTE — Progress Notes (Signed)
US 12+2 wks,measurements c/w dates,NB present,NT 1.4 mm,crl 59.9 mm,normal ov's bilat,fht 163 bpm

## 2016-03-26 NOTE — Patient Instructions (Signed)
Second Trimester of Pregnancy The second trimester is from week 13 through week 28, months 4 through 6. The second trimester is often a time when you feel your best. Your body has also adjusted to being pregnant, and you begin to feel better physically. Usually, morning sickness has lessened or quit completely, you may have more energy, and you may have an increase in appetite. The second trimester is also a time when the fetus is growing rapidly. At the end of the sixth month, the fetus is about 9 inches long and weighs about 1 pounds. You will likely begin to feel the baby move (quickening) between 18 and 20 weeks of the pregnancy. BODY CHANGES Your body goes through many changes during pregnancy. The changes vary from woman to woman.   Your weight will continue to increase. You will notice your lower abdomen bulging out.  You may begin to get stretch marks on your hips, abdomen, and breasts.  You may develop headaches that can be relieved by medicines approved by your health care provider.  You may urinate more often because the fetus is pressing on your bladder.  You may develop or continue to have heartburn as a result of your pregnancy.  You may develop constipation because certain hormones are causing the muscles that push waste through your intestines to slow down.  You may develop hemorrhoids or swollen, bulging veins (varicose veins).  You may have back pain because of the weight gain and pregnancy hormones relaxing your joints between the bones in your pelvis and as a result of a shift in weight and the muscles that support your balance.  Your breasts will continue to grow and be tender.  Your gums may bleed and may be sensitive to brushing and flossing.  Dark spots or blotches (chloasma, mask of pregnancy) may develop on your face. This will likely fade after the baby is born.  A dark line from your belly button to the pubic area (linea nigra) may appear. This will likely fade  after the baby is born.  You may have changes in your hair. These can include thickening of your hair, rapid growth, and changes in texture. Some women also have hair loss during or after pregnancy, or hair that feels dry or thin. Your hair will most likely return to normal after your baby is born. WHAT TO EXPECT AT YOUR PRENATAL VISITS During a routine prenatal visit:  You will be weighed to make sure you and the fetus are growing normally.  Your blood pressure will be taken.  Your abdomen will be measured to track your baby's growth.  The fetal heartbeat will be listened to.  Any test results from the previous visit will be discussed. Your health care provider may ask you:  How you are feeling.  If you are feeling the baby move.  If you have had any abnormal symptoms, such as leaking fluid, bleeding, severe headaches, or abdominal cramping.  If you are using any tobacco products, including cigarettes, chewing tobacco, and electronic cigarettes.  If you have any questions. Other tests that may be performed during your second trimester include:  Blood tests that check for:  Low iron levels (anemia).  Gestational diabetes (between 24 and 28 weeks).  Rh antibodies.  Urine tests to check for infections, diabetes, or protein in the urine.  An ultrasound to confirm the proper growth and development of the baby.  An amniocentesis to check for possible genetic problems.  Fetal screens for spina bifida   and Down syndrome.  HIV (human immunodeficiency virus) testing. Routine prenatal testing includes screening for HIV, unless you choose not to have this test. HOME CARE INSTRUCTIONS   Avoid all smoking, herbs, alcohol, and unprescribed drugs. These chemicals affect the formation and growth of the baby.  Do not use any tobacco products, including cigarettes, chewing tobacco, and electronic cigarettes. If you need help quitting, ask your health care provider. You may receive  counseling support and other resources to help you quit.  Follow your health care provider's instructions regarding medicine use. There are medicines that are either safe or unsafe to take during pregnancy.  Exercise only as directed by your health care provider. Experiencing uterine cramps is a good sign to stop exercising.  Continue to eat regular, healthy meals.  Wear a good support bra for breast tenderness.  Do not use hot tubs, steam rooms, or saunas.  Wear your seat belt at all times when driving.  Avoid raw meat, uncooked cheese, cat litter boxes, and soil used by cats. These carry germs that can cause birth defects in the baby.  Take your prenatal vitamins.  Take 1500-2000 mg of calcium daily starting at the 20th week of pregnancy until you deliver your baby.  Try taking a stool softener (if your health care provider approves) if you develop constipation. Eat more high-fiber foods, such as fresh vegetables or fruit and whole grains. Drink plenty of fluids to keep your urine clear or pale yellow.  Take warm sitz baths to soothe any pain or discomfort caused by hemorrhoids. Use hemorrhoid cream if your health care provider approves.  If you develop varicose veins, wear support hose. Elevate your feet for 15 minutes, 3-4 times a day. Limit salt in your diet.  Avoid heavy lifting, wear low heel shoes, and practice good posture.  Rest with your legs elevated if you have leg cramps or low back pain.  Visit your dentist if you have not gone yet during your pregnancy. Use a soft toothbrush to brush your teeth and be gentle when you floss.  A sexual relationship may be continued unless your health care provider directs you otherwise.  Continue to go to all your prenatal visits as directed by your health care provider. SEEK MEDICAL CARE IF:   You have dizziness.  You have mild pelvic cramps, pelvic pressure, or nagging pain in the abdominal area.  You have persistent nausea,  vomiting, or diarrhea.  You have a bad smelling vaginal discharge.  You have pain with urination. SEEK IMMEDIATE MEDICAL CARE IF:   You have a fever.  You are leaking fluid from your vagina.  You have spotting or bleeding from your vagina.  You have severe abdominal cramping or pain.  You have rapid weight gain or loss.  You have shortness of breath with chest pain.  You notice sudden or extreme swelling of your face, hands, ankles, feet, or legs.  You have not felt your baby move in over an hour.  You have severe headaches that do not go away with medicine.  You have vision changes.   This information is not intended to replace advice given to you by your health care provider. Make sure you discuss any questions you have with your health care provider.   Document Released: 10/02/2001 Document Revised: 10/29/2014 Document Reviewed: 12/09/2012 Elsevier Interactive Patient Education 2016 [employer]. Follow in 4 weeks for colpo and second IT draw Bacterial Vaginosis Bacterial vaginosis is a vaginal infection that occurs when the  normal balance of bacteria in the vagina is disrupted. It results from an overgrowth of certain bacteria. This is the most common vaginal infection in women of childbearing age. Treatment is important to prevent complications, especially in pregnant women, as it can cause a premature delivery. CAUSES  Bacterial vaginosis is caused by an increase in harmful bacteria that are normally present in smaller amounts in the vagina. Several different kinds of bacteria can cause bacterial vaginosis. However, the reason that the condition develops is not fully understood. RISK FACTORS Certain activities or behaviors can put you at an increased risk of developing bacterial vaginosis, including:  Having a new sex partner or multiple sex partners.  Douching.  Using an intrauterine device (IUD) for contraception. Women do not get bacterial vaginosis from toilet  seats, bedding, swimming pools, or contact with objects around them. SIGNS AND SYMPTOMS  Some women with bacterial vaginosis have no signs or symptoms. Common symptoms include:  Grey vaginal discharge.  A fishlike odor with discharge, especially after sexual intercourse.  Itching or burning of the vagina and vulva.  Burning or pain with urination. DIAGNOSIS  Your health care provider will take a medical history and examine the vagina for signs of bacterial vaginosis. A sample of vaginal fluid may be taken. Your health care provider will look at this sample under a microscope to check for bacteria and abnormal cells. A vaginal pH test may also be done.  TREATMENT  Bacterial vaginosis may be treated with antibiotic medicines. These may be given in the form of a pill or a vaginal cream. A second round of antibiotics may be prescribed if the condition comes back after treatment. Because bacterial vaginosis increases your risk for sexually transmitted diseases, getting treated can help reduce your risk for chlamydia, gonorrhea, HIV, and herpes. HOME CARE INSTRUCTIONS   Only take over-the-counter or prescription medicines as directed by your health care provider.  If antibiotic medicine was prescribed, take it as directed. Make sure you finish it even if you start to feel better.  Tell all sexual partners that you have a vaginal infection. They should see their health care provider and be treated if they have problems, such as a mild rash or itching.  During treatment, it is important that you follow these instructions:  Avoid sexual activity or use condoms correctly.  Do not douche.  Avoid alcohol as directed by your health care provider.  Avoid breastfeeding as directed by your health care provider. SEEK MEDICAL CARE IF:   Your symptoms are not improving after 3 days of treatment.  You have increased discharge or pain.  You have a fever. MAKE SURE YOU:   Understand these  instructions.  Will watch your condition.  Will get help right away if you are not doing well or get worse. FOR MORE INFORMATION  Centers for Disease Control and Prevention, Division of STD Prevention: SolutionApps.co.zawww.cdc.gov/std American Sexual Health Association (ASHA): www.ashastd.org    This information is not intended to replace advice given to you by your health care provider. Make sure you discuss any questions you have with your health care provider.   Document Released: 10/08/2005 Document Revised: 10/29/2014 Document Reviewed: 05/20/2013 Elsevier Interactive Patient Education [employer]2016 Elsevier Inc. No alcohol or sex during treatment

## 2016-03-26 NOTE — Progress Notes (Signed)
[id number] 5640w2d Estimated Date of Delivery: 10/06/16  Blood pressure 104/78, pulse 82, weight 173 lb 8 oz (78.699 kg), last menstrual period 12/13/2015.   BP weight and urine results all reviewed and noted.  Please refer to the obstetrical flow sheet for the fundal height and fetal heart rate documentation:  Patient  denies any bleeding and no rupture of membranes symptoms or regular contractions.Has cramps and vaginal irritation, on exam has white discharge with slight odor, no lesions or rash seen, wet prep:+WBC and +Clue cells, will rx flagyl 500 mg 1 bid x 7 days, no sex or alcohol.Ok to take tylenol and push fluids and rest. All questions were answered.  Orders Placed This Encounter  Procedures  . Maternal Screen, Integrated #1  . POCT Urinalysis Dipstick    Plan:  Continued routine obstetrical care,   Follow up in 4 weeks for colpo with Dr Despina HiddenEure, and second IT draw

## 2016-03-28 LAB — MATERNAL SCREEN, INTEGRATED #1
CROWN RUMP LENGTH MAT SCREEN: 59.9 mm
GEST. AGE ON COLLECTION DATE: 12.4 wk
MATERNAL AGE AT EDD: 29.9 a
NUMBER OF FETUSES: 1
Nuchal Translucency (NT): 1.4 mm
PAPP-A Value: 805.4 ng/mL
WEIGHT: 174 [lb_av]

## 2016-04-02 ENCOUNTER — Encounter: Payer: Medicaid Other | Admitting: Women's Health

## 2016-04-02 ENCOUNTER — Other Ambulatory Visit: Payer: Medicaid Other

## 2016-04-23 ENCOUNTER — Encounter: Payer: Medicaid Other | Admitting: Obstetrics & Gynecology

## 2016-04-26 ENCOUNTER — Ambulatory Visit (INDEPENDENT_AMBULATORY_CARE_PROVIDER_SITE_OTHER): Payer: Medicaid Other | Admitting: Obstetrics & Gynecology

## 2016-04-26 ENCOUNTER — Encounter: Payer: Self-pay | Admitting: Obstetrics & Gynecology

## 2016-04-26 VITALS — BP 100/70 | HR 76 | Wt 174.0 lb

## 2016-04-26 DIAGNOSIS — R87610 Atypical squamous cells of undetermined significance on cytologic smear of cervix (ASC-US): Secondary | ICD-10-CM

## 2016-04-26 DIAGNOSIS — R87810 Cervical high risk human papillomavirus (HPV) DNA test positive: Secondary | ICD-10-CM | POA: Diagnosis not present

## 2016-04-26 DIAGNOSIS — Z369 Encounter for antenatal screening, unspecified: Secondary | ICD-10-CM

## 2016-04-26 DIAGNOSIS — Z1389 Encounter for screening for other disorder: Secondary | ICD-10-CM

## 2016-04-26 DIAGNOSIS — Z331 Pregnant state, incidental: Secondary | ICD-10-CM | POA: Diagnosis not present

## 2016-04-26 DIAGNOSIS — IMO0002 Reserved for concepts with insufficient information to code with codable children: Secondary | ICD-10-CM

## 2016-04-26 DIAGNOSIS — Z3A17 17 weeks gestation of pregnancy: Secondary | ICD-10-CM

## 2016-04-26 DIAGNOSIS — Z3492 Encounter for supervision of normal pregnancy, unspecified, second trimester: Secondary | ICD-10-CM

## 2016-04-26 LAB — POCT URINALYSIS DIPSTICK
Blood, UA: NEGATIVE
Glucose, UA: NEGATIVE
KETONES UA: NEGATIVE
LEUKOCYTES UA: NEGATIVE
Nitrite, UA: NEGATIVE
PROTEIN UA: NEGATIVE

## 2016-04-26 NOTE — Progress Notes (Signed)
Colposcopy Procedure Note:  Colposcopy Procedure Note  Indications: Pap smear 2 months ago showed: ASCUS with POSITIVE high risk HPV. The prior pap showed ASCUS with POSITIVE high risk HPV.  Prior cervical/vaginal disease: normal exam without visible pathology. Prior cervical treatment: no treatment.  Smoker:  No. New sexual partner:  No.  : time frame:  No.  History of abnormal Pap: yes  Procedure Details  The risks and benefits of the procedure and Written informed consent obtained.  Speculum placed in vagina and excellent visualization of cervix achieved, cervix swabbed x 3 with acetic acid solution.  Findings: Cervix: visible lesion(s) at 6 o'clock; no biopsies taken. Vaginal inspection: normal without visible lesions. Vulvar colposcopy: vulvar colposcopy not performed.  Specimens: none  Complications: none.  Plan: Follow up colpo at 6-8 weeks pp  [id number] 5771w5d Estimated Date of Delivery: 10/06/16  Blood pressure 100/70, pulse 76, weight 174 lb (78.926 kg), last menstrual period 12/13/2015.   BP weight and urine results all reviewed and noted.  Please refer to the obstetrical flow sheet for the fundal height and fetal heart rate documentation:  Patient reports good fetal movement, denies any bleeding and no rupture of membranes symptoms or regular contractions. Patient is without complaints. All questions were answered.  Orders Placed This Encounter  Procedures  . Maternal Screen, Integrated #2  . POCT urinalysis dipstick    Plan:  Continued routine obstetrical care, 2nd IT  No Follow-up on file.

## 2016-04-30 LAB — MATERNAL SCREEN, INTEGRATED #2
AFP MARKER: 35.7 ng/mL
AFP MOM: 1.04
Crown Rump Length: 59.9 mm
DIA MoM: 0.92
DIA Value: 145.9 pg/mL
Estriol, Unconjugated: 0.91 ng/mL
GEST. AGE ON COLLECTION DATE: 12.4 wk
Gestational Age: 16.9 weeks
HCG MOM: 1.03
MATERNAL AGE AT EDD: 29.9 a
NUCHAL TRANSLUCENCY (NT): 1.4 mm
Nuchal Translucency MoM: 0.91
Number of Fetuses: 1
PAPP-A MOM: 1.03
PAPP-A VALUE: 805.4 ng/mL
Test Results:: NEGATIVE
WEIGHT: 174 [lb_av]
WEIGHT: 174 [lb_av]
hCG Value: 28.1 IU/mL
uE3 MoM: 0.97

## 2016-05-22 ENCOUNTER — Other Ambulatory Visit: Payer: Self-pay | Admitting: Obstetrics & Gynecology

## 2016-05-22 ENCOUNTER — Encounter: Payer: Self-pay | Admitting: Women's Health

## 2016-05-22 ENCOUNTER — Ambulatory Visit (INDEPENDENT_AMBULATORY_CARE_PROVIDER_SITE_OTHER): Payer: Medicaid Other

## 2016-05-22 ENCOUNTER — Ambulatory Visit (INDEPENDENT_AMBULATORY_CARE_PROVIDER_SITE_OTHER): Payer: Medicaid Other | Admitting: Women's Health

## 2016-05-22 VITALS — BP 110/68 | HR 68 | Wt 178.0 lb

## 2016-05-22 DIAGNOSIS — Z3482 Encounter for supervision of other normal pregnancy, second trimester: Secondary | ICD-10-CM

## 2016-05-22 DIAGNOSIS — Z1389 Encounter for screening for other disorder: Secondary | ICD-10-CM

## 2016-05-22 DIAGNOSIS — Z3A21 21 weeks gestation of pregnancy: Secondary | ICD-10-CM

## 2016-05-22 DIAGNOSIS — Z36 Encounter for antenatal screening of mother: Secondary | ICD-10-CM | POA: Diagnosis not present

## 2016-05-22 DIAGNOSIS — O283 Abnormal ultrasonic finding on antenatal screening of mother: Secondary | ICD-10-CM

## 2016-05-22 DIAGNOSIS — R87610 Atypical squamous cells of undetermined significance on cytologic smear of cervix (ASC-US): Secondary | ICD-10-CM

## 2016-05-22 DIAGNOSIS — Z331 Pregnant state, incidental: Secondary | ICD-10-CM

## 2016-05-22 DIAGNOSIS — Z3492 Encounter for supervision of normal pregnancy, unspecified, second trimester: Secondary | ICD-10-CM

## 2016-05-22 DIAGNOSIS — R87810 Cervical high risk human papillomavirus (HPV) DNA test positive: Secondary | ICD-10-CM

## 2016-05-22 LAB — POCT URINALYSIS DIPSTICK
GLUCOSE UA: NEGATIVE
Ketones, UA: NEGATIVE
LEUKOCYTES UA: NEGATIVE
NITRITE UA: NEGATIVE
Protein, UA: NEGATIVE
RBC UA: NEGATIVE

## 2016-05-22 NOTE — Patient Instructions (Signed)

## 2016-05-22 NOTE — Progress Notes (Signed)
Low-risk OB appointment [id number] 100w3d Estimated Date of Delivery: 10/06/16 BP 110/68   Pulse 68   Wt 178 lb (80.7 kg)   LMP 12/13/2015 (Approximate)   BMI 34.76 kg/m   BP, weight, and urine reviewed.  Refer to obstetrical flow sheet for FH & FHR.  Reports good fm.  Denies regular uc's, lof, vb, or uti s/s. No complaints. Reviewed today's anatomy u/s, Lt EICF otherwise normal- had neg nt/it, gave printed info on EICF, no further f/u needed. Discussed warning s/s to report, fm Plan:  Continue routine obstetrical care  F/U in 4wks for OB appointment

## 2016-06-07 ENCOUNTER — Encounter (HOSPITAL_COMMUNITY): Payer: Self-pay

## 2016-06-07 ENCOUNTER — Inpatient Hospital Stay (HOSPITAL_COMMUNITY)
Admission: AD | Admit: 2016-06-07 | Discharge: 2016-06-07 | Disposition: A | Discharge status: Home / Self Care | Payer: Medicaid Other | Source: Ambulatory Visit | Attending: Obstetrics and Gynecology | Admitting: Obstetrics and Gynecology

## 2016-06-07 DIAGNOSIS — O4692 Antepartum hemorrhage, unspecified, second trimester: Secondary | ICD-10-CM | POA: Diagnosis not present

## 2016-06-07 DIAGNOSIS — Z3492 Encounter for supervision of normal pregnancy, unspecified, second trimester: Secondary | ICD-10-CM

## 2016-06-07 DIAGNOSIS — Z3A22 22 weeks gestation of pregnancy: Secondary | ICD-10-CM | POA: Insufficient documentation

## 2016-06-07 DIAGNOSIS — B3731 Acute candidiasis of vulva and vagina: Secondary | ICD-10-CM

## 2016-06-07 DIAGNOSIS — Z3A28 28 weeks gestation of pregnancy: Secondary | ICD-10-CM | POA: Diagnosis not present

## 2016-06-07 DIAGNOSIS — B373 Candidiasis of vulva and vagina: Secondary | ICD-10-CM | POA: Diagnosis not present

## 2016-06-07 DIAGNOSIS — O98812 Other maternal infectious and parasitic diseases complicating pregnancy, second trimester: Secondary | ICD-10-CM | POA: Insufficient documentation

## 2016-06-07 DIAGNOSIS — O4690 Antepartum hemorrhage, unspecified, unspecified trimester: Secondary | ICD-10-CM

## 2016-06-07 DIAGNOSIS — N939 Abnormal uterine and vaginal bleeding, unspecified: Secondary | ICD-10-CM | POA: Diagnosis present

## 2016-06-07 LAB — URINALYSIS, ROUTINE W REFLEX MICROSCOPIC
BILIRUBIN URINE: NEGATIVE
Glucose, UA: NEGATIVE mg/dL
Ketones, ur: NEGATIVE mg/dL
NITRITE: NEGATIVE
PROTEIN: NEGATIVE mg/dL
Specific Gravity, Urine: 1.01 (ref 1.005–1.030)
pH: 7 (ref 5.0–8.0)

## 2016-06-07 LAB — URINE MICROSCOPIC-ADD ON

## 2016-06-07 LAB — WET PREP, GENITAL
Clue Cells Wet Prep HPF POC: NONE SEEN
Sperm: NONE SEEN
TRICH WET PREP: NONE SEEN

## 2016-06-07 MED ORDER — TERCONAZOLE 0.4 % VA CREA: 1.0000 | TOPICAL_CREAM | Freq: Every day | VAGINAL | 0 refills | Status: AC

## 2016-06-07 NOTE — MAU Provider Note (Signed)
History     CSN: [id number]  Arrival date and time: 06/07/16 1120   First Provider Initiated Contact with Patient 06/07/16 1221      Chief Complaint  Patient presents with  . Vaginal Bleeding   30 y/o G3P2002 at 22 5/7 weeks Patient presents with painless bleeding x2 that started at 6am today. Woke from sleep, went to urinate and saw streaks of blood on wiping, self resolved after a shower. 2nd episode was post nap with similar. Woke up from nap and saw blood from wiping after urinating, checked her vagina and saw small clot that also self resolved. Currently denies bleeding, LOF. Endorses good fetal movement.     Past Medical History:  Diagnosis Date  . Abnormal pap   . BV (bacterial vaginosis)   . Contraceptive management 03/10/2015  . Cramping affecting pregnancy, antepartum 03/26/2016  . Encounter for IUD removal 03/10/2015  . Vaginal discharge 03/26/2016  . Vaginal irritation 03/26/2016  . Vaginal Pap smear, abnormal     Past Surgical History:  Procedure Laterality Date  . NO PAST SURGERIES      Family History  Problem Relation Age of Onset  . Cancer Maternal Grandmother   . Thyroid disease Mother   . Cancer Paternal Aunt   . Diabetes Other     lots of aunts and uncles on both sides    Social History  Substance Use Topics  . Smoking status: Never Smoker  . Smokeless tobacco: Never Used  . Alcohol use No    Allergies: No Known Allergies  Prescriptions Prior to Admission  Medication Sig Dispense Refill Last Dose  . CALCIUM PO Take 270 mg by mouth 4 (four) times daily as needed (for heartburn).   06/06/2016 at Unknown time  . Pediatric Multivit-Minerals-C (FLINTSTONES GUMMIES PLUS PO) Take 2 each by mouth daily.   06/06/2016 at Unknown time  . Doxylamine-Pyridoxine (DICLEGIS) 10-10 MG TBEC 2 tabs q hs, if sx persist add 1 tab q am on day 3, if sx persist add 1 tab q afternoon on day 4 (Patient not taking: Reported on 05/22/2016) 100 tablet 6 Not Taking    Review of  Systems  Constitutional: Negative for chills and fever.  Eyes: Negative for blurred vision.  Respiratory: Negative for shortness of breath and wheezing.   Cardiovascular: Positive for palpitations. Negative for chest pain.  Gastrointestinal: Negative for abdominal pain, heartburn, nausea and vomiting.  Neurological: Negative for headaches.  Psychiatric/Behavioral: The patient is nervous/anxious.    Physical Exam   Blood pressure 123/70, pulse 99, temperature 98.4 F (36.9 C), temperature source Oral, resp. rate 18, height 5\' 1"  (1.549 m), weight 80.7 kg (178 lb), last menstrual period 12/13/2015.  Physical Exam  Constitutional: She is oriented to person, place, and time. She appears well-developed and well-nourished. No distress.  HENT:  Head: Normocephalic and atraumatic.  Eyes: EOM are normal.  Neck: Normal range of motion.  Cardiovascular: Normal rate, regular rhythm and normal heart sounds.   No murmur heard. Respiratory: No respiratory distress.  GI: Soft. She exhibits no distension. There is no tenderness.  Neurological: She is alert and oriented to person, place, and time.  Skin: Skin is warm and dry. She is not diaphoretic.  Psychiatric: She has a normal mood and affect.  SVE: closed/long/high  MAU Course  Procedures SSE negative for bleeding GC/CT cultures [place] SVE : closed/long/high  MDM Plan of care reviewed with patient, including labs and tests ordered and medical treatment.  Assessment and Plan  30 y/o G3P2002 at 2622 5/7 weeks  Vaginal bleeding during pregnancy - given return precautions - GC/Chlamydia probe pending  Candidal vaginitis - terconazole vaginal cream - follow up as outpatient   Leland HerElsia J Yoo PGY-1 06/07/2016, 12:42 PM    OB FELLOW MAU DISCHARGE ATTESTATION  I have seen and examined this patient; I agree with above documentation in the resident's note.    Jen MowElizabeth Jibran Crookshanks, DO OB Fellow 12:50 PM

## 2016-06-07 NOTE — Progress Notes (Signed)
RN at bedside attempting to obtain FHR with doppler for 5 mins. Unable to obtain. Melainie Bhambri,CNM to bedside and bedside u/s done. FHR observed to be 172 and fetal movement noted.

## 2016-06-07 NOTE — Discharge Instructions (Signed)
Vaginal Bleeding During Pregnancy, Second Trimester A small amount of bleeding (spotting) from the vagina is relatively common in pregnancy. It usually stops on its own. Various things can cause bleeding or spotting in pregnancy. Some bleeding may be related to the pregnancy, and some may not. Sometimes the bleeding is normal and is not a problem. However, bleeding can also be a sign of something serious. Be sure to tell your health care provider about any vaginal bleeding right away. Some possible causes of vaginal bleeding during the second trimester include:  Infection, inflammation, or growths on the cervix.   The placenta may be partially or completely covering the opening of the cervix inside the uterus (placenta previa).  The placenta may have separated from the uterus (abruption of the placenta).   You may be having early (preterm) labor.   The cervix may not be strong enough to keep a baby inside the uterus (cervical insufficiency).   Tiny cysts may have developed in the uterus instead of pregnancy tissue (molar pregnancy). HOME CARE INSTRUCTIONS  Watch your condition for any changes. The following actions may help to lessen any discomfort you are feeling:  Follow your health care provider's instructions for limiting your activity. If your health care provider orders bed rest, you may need to stay in bed and only get up to use the bathroom. However, your health care provider may allow you to continue light activity.  If needed, make plans for someone to help with your regular activities and responsibilities while you are on bed rest.  Keep track of the number of pads you use each day, how often you change pads, and how soaked (saturated) they are. Write this down.  Do not use tampons. Do not douche.  Do not have sexual intercourse or orgasms until approved by your health care provider.  If you pass any tissue from your vagina, save the tissue so you can show it to your  health care provider.  Only take over-the-counter or prescription medicines as directed by your health care provider.  Do not take aspirin because it can make you bleed.  Do not exercise or perform any strenuous activities or heavy lifting without your health care provider's permission.  Keep all follow-up appointments as directed by your health care provider. SEEK MEDICAL CARE IF:  You have any vaginal bleeding during any part of your pregnancy.  You have cramps or labor pains.  You have a fever, not controlled by medicine. SEEK IMMEDIATE MEDICAL CARE IF:   You have severe cramps in your back or belly (abdomen).  You have contractions.  You have chills.  You pass large clots or tissue from your vagina.  Your bleeding increases.  You feel light-headed or weak, or you have fainting episodes.  You are leaking fluid or have a gush of fluid from your vagina. MAKE SURE YOU:  Understand these instructions.  Will watch your condition.  Will get help right away if you are not doing well or get worse.   This information is not intended to replace advice given to you by your health care provider. Make sure you discuss any questions you have with your health care provider.   Document Released: 07/18/2005 Document Revised: 10/13/2013 Document Reviewed: 06/15/2013 Elsevier Interactive Patient Education 2016 [employer]   Vaginitis Vaginitis is an inflammation of the vagina. It can happen when the normal bacteria and yeast in the vagina grow too much. There are different types. Treatment will depend on the type you  have. HOME CARE  Take all medicines as told by your doctor.  Keep your vagina area clean and dry. Avoid soap. Rinse the area with water.  Avoid washing and cleaning out the vagina (douching).  Do not use tampons or have sex (intercourse) until your treatment is done.  Wipe from front to back after going to the restroom.  Wear cotton underwear.  Avoid  wearing underwear while you sleep until your vaginitis is gone.  Avoid tight pants. Avoid underwear or nylons without a cotton panel.  Take off wet clothing (such as a bathing suit) as soon as you can.  Use mild, unscented products. Avoid fabric softeners and scented:  Feminine sprays.  Laundry detergents.  Tampons.  Soaps or bubble baths.  Practice safe sex and use condoms. GET HELP RIGHT AWAY IF:   You have belly (abdominal) pain.  You have a fever or lasting symptoms for more than 2-3 days.  You have a fever and your symptoms suddenly get worse. MAKE SURE YOU:   Understand these instructions.  Will watch this condition.  Will get help right away if you are not doing well or get worse.   This information is not intended to replace advice given to you by your health care provider. Make sure you discuss any questions you have with your health care provider.   Document Released: 01/04/2009 Document Revised: 07/02/2012 Document Reviewed: 03/20/2012 Elsevier Interactive Patient Education [employer]2016 Elsevier Inc.

## 2016-06-07 NOTE — MAU Note (Signed)
Pt states she woke up around 0600 bleeding.  Happened again around 1000, sees blood on toilet tissue.  Felt pressure once when she went to the BR this morning, none now.  Tearful & worried in triage.

## 2016-06-08 LAB — GC/CHLAMYDIA PROBE AMP (~~LOC~~) NOT AT ARMC
Chlamydia: NEGATIVE
Neisseria Gonorrhea: NEGATIVE

## 2016-06-19 ENCOUNTER — Ambulatory Visit (INDEPENDENT_AMBULATORY_CARE_PROVIDER_SITE_OTHER): Payer: Medicaid Other | Admitting: Advanced Practice Midwife

## 2016-06-19 VITALS — BP 130/50 | HR 90 | Wt 182.0 lb

## 2016-06-19 DIAGNOSIS — Z331 Pregnant state, incidental: Secondary | ICD-10-CM

## 2016-06-19 DIAGNOSIS — Z1389 Encounter for screening for other disorder: Secondary | ICD-10-CM

## 2016-06-19 DIAGNOSIS — Z3492 Encounter for supervision of normal pregnancy, unspecified, second trimester: Secondary | ICD-10-CM

## 2016-06-19 LAB — POCT URINALYSIS DIPSTICK
GLUCOSE UA: NEGATIVE
Ketones, UA: NEGATIVE
Leukocytes, UA: NEGATIVE
NITRITE UA: NEGATIVE
RBC UA: NEGATIVE

## 2016-06-19 NOTE — Progress Notes (Signed)
[id number] 6770w3d Estimated Date of Delivery: 10/06/16  Blood pressure (!) 130/50, pulse 90, weight 182 lb (82.6 kg), last menstrual period 12/13/2015.   BP weight and urine results all reviewed and noted.  Please refer to the obstetrical flow sheet for the fundal height and fetal heart rate documentation:  Patient reports good fetal movement, denies any bleeding and no rupture of membranes symptoms or regular contractions. Patient is without complaints. All questions were answered.  Orders Placed This Encounter  Procedures  . POCT urinalysis dipstick    Plan:  Continued routine obstetrical care,   Return in about 3 weeks (around 07/10/2016) for PN2/LROB.

## 2016-06-19 NOTE — Patient Instructions (Signed)

## 2016-07-10 ENCOUNTER — Other Ambulatory Visit: Payer: Medicaid Other

## 2016-07-11 ENCOUNTER — Encounter: Payer: Self-pay | Admitting: Women's Health

## 2016-07-11 ENCOUNTER — Ambulatory Visit (INDEPENDENT_AMBULATORY_CARE_PROVIDER_SITE_OTHER): Payer: Medicaid Other | Admitting: Women's Health

## 2016-07-11 ENCOUNTER — Other Ambulatory Visit: Payer: Medicaid Other

## 2016-07-11 VITALS — BP 102/54 | HR 76 | Wt 184.0 lb

## 2016-07-11 DIAGNOSIS — Z1389 Encounter for screening for other disorder: Secondary | ICD-10-CM

## 2016-07-11 DIAGNOSIS — Z131 Encounter for screening for diabetes mellitus: Secondary | ICD-10-CM

## 2016-07-11 DIAGNOSIS — Z331 Pregnant state, incidental: Secondary | ICD-10-CM

## 2016-07-11 DIAGNOSIS — Z3492 Encounter for supervision of normal pregnancy, unspecified, second trimester: Secondary | ICD-10-CM

## 2016-07-11 DIAGNOSIS — Z369 Encounter for antenatal screening, unspecified: Secondary | ICD-10-CM

## 2016-07-11 DIAGNOSIS — Z3A28 28 weeks gestation of pregnancy: Secondary | ICD-10-CM

## 2016-07-11 DIAGNOSIS — Z3482 Encounter for supervision of other normal pregnancy, second trimester: Secondary | ICD-10-CM

## 2016-07-11 LAB — POCT URINALYSIS DIPSTICK
Blood, UA: NEGATIVE
Glucose, UA: NEGATIVE
Ketones, UA: NEGATIVE
LEUKOCYTES UA: NEGATIVE
Nitrite, UA: NEGATIVE
PROTEIN UA: NEGATIVE

## 2016-07-11 NOTE — Patient Instructions (Signed)

## 2016-07-11 NOTE — Progress Notes (Signed)
Low-risk OB appointment [id number] 1367w4d Estimated Date of Delivery: 10/06/16 BP (!) 102/54   Pulse 76   Wt 184 lb (83.5 kg)   LMP 12/13/2015 (Approximate)   BMI 34.77 kg/m   BP, weight, and urine reviewed.  Refer to obstetrical flow sheet for FH & FHR.  Reports good fm.  Denies regular uc's, lof, vb, or uti s/s. No complaints. Still desires interval salpingectomy, discussed risks/benefits/questions answered, consent signed today.  Reviewed ptl s/s, fkc. Recommended Tdap at HD/PCP per CDC guidelines.  Plan:  Continue routine obstetrical care  F/U in 3wks for OB appointment  PN2 today Declines flu shot today

## 2016-07-12 LAB — CBC
HEMATOCRIT: 34.1 % (ref 34.0–46.6)
HEMOGLOBIN: 11.7 g/dL (ref 11.1–15.9)
MCH: 29.8 pg (ref 26.6–33.0)
MCHC: 34.3 g/dL (ref 31.5–35.7)
MCV: 87 fL (ref 79–97)
Platelets: 169 10*3/uL (ref 150–379)
RBC: 3.92 x10E6/uL (ref 3.77–5.28)
RDW: 13.4 % (ref 12.3–15.4)
WBC: 6.9 10*3/uL (ref 3.4–10.8)

## 2016-07-12 LAB — GLUCOSE TOLERANCE, 2 HOURS W/ 1HR
GLUCOSE, 2 HOUR: 92 mg/dL (ref 65–152)
Glucose, 1 hour: 163 mg/dL (ref 65–179)
Glucose, Fasting: 77 mg/dL (ref 65–91)

## 2016-07-12 LAB — RPR: RPR Ser Ql: NONREACTIVE

## 2016-07-12 LAB — HIV ANTIBODY (ROUTINE TESTING W REFLEX): HIV SCREEN 4TH GENERATION: NONREACTIVE

## 2016-07-12 LAB — ANTIBODY SCREEN: ANTIBODY SCREEN: NEGATIVE

## 2016-08-01 ENCOUNTER — Encounter: Payer: Self-pay | Admitting: Advanced Practice Midwife

## 2016-08-01 ENCOUNTER — Ambulatory Visit (INDEPENDENT_AMBULATORY_CARE_PROVIDER_SITE_OTHER): Payer: Medicaid Other | Admitting: Advanced Practice Midwife

## 2016-08-01 VITALS — BP 96/58 | HR 92 | Wt 185.0 lb

## 2016-08-01 DIAGNOSIS — Z1389 Encounter for screening for other disorder: Secondary | ICD-10-CM

## 2016-08-01 DIAGNOSIS — Z331 Pregnant state, incidental: Secondary | ICD-10-CM

## 2016-08-01 DIAGNOSIS — Z3482 Encounter for supervision of other normal pregnancy, second trimester: Secondary | ICD-10-CM

## 2016-08-01 LAB — POCT URINALYSIS DIPSTICK
Glucose, UA: NEGATIVE
Ketones, UA: NEGATIVE
NITRITE UA: NEGATIVE
RBC UA: NEGATIVE

## 2016-08-01 MED ORDER — HYDROCORTISONE ACE-PRAMOXINE 1-1 % RE CREA
1.0000 | TOPICAL_CREAM | Freq: Four times a day (QID) | RECTAL | 2 refills | Status: DC
Start: 2016-08-01 — End: 2016-09-20

## 2016-08-01 NOTE — Patient Instructions (Signed)
hemoHemorrhoids Hemorrhoids are swollen veins around the rectum or anus. There are two types of hemorrhoids:   Internal hemorrhoids. These occur in the veins just inside the rectum. They may poke through to the outside and become irritated and painful.  External hemorrhoids. These occur in the veins outside the anus and can be felt as a painful swelling or hard lump near the anus. CAUSES  Pregnancy.   Obesity.   Constipation or diarrhea.   Straining to have a bowel movement.   Sitting for long periods on the toilet.  Heavy lifting or other activity that caused you to strain.  Anal intercourse. SYMPTOMS   Pain.   Anal itching or irritation.   Rectal bleeding.   Fecal leakage.   Anal swelling.   One or more lumps around the anus.  DIAGNOSIS  Your caregiver may be able to diagnose hemorrhoids by visual examination. Other examinations or tests that may be performed include:   Examination of the rectal area with a gloved hand (digital rectal exam).   Examination of anal canal using a small tube (scope).   A blood test if you have lost a significant amount of blood.  A test to look inside the colon (sigmoidoscopy or colonoscopy). TREATMENT Most hemorrhoids can be treated at home. However, if symptoms do not seem to be getting better or if you have a lot of rectal bleeding, your caregiver may perform a procedure to help make the hemorrhoids get smaller or remove them completely. Possible treatments include:   Placing a rubber band at the base of the hemorrhoid to cut off the circulation (rubber band ligation).   Injecting a chemical to shrink the hemorrhoid (sclerotherapy).   Using a tool to burn the hemorrhoid (infrared light therapy).   Surgically removing the hemorrhoid (hemorrhoidectomy).   Stapling the hemorrhoid to block blood flow to the tissue (hemorrhoid stapling).  HOME CARE INSTRUCTIONS   Eat foods with fiber, such as whole grains, beans,  nuts, fruits, and vegetables. Ask your doctor about taking products with added fiber in them (fibersupplements).  Increase fluid intake. Drink enough water and fluids to keep your urine clear or pale yellow.   Exercise regularly.   Go to the bathroom when you have the urge to have a bowel movement. Do not wait.   Avoid straining to have bowel movements.   Keep the anal area dry and clean. Use wet toilet paper or moist towelettes after a bowel movement.   Medicated creams and suppositories may be used or applied as directed.   Only take over-the-counter or prescription medicines as directed by your caregiver.   Take warm sitz baths for 15-20 minutes, 3-4 times a day to ease pain and discomfort.   Place ice packs on the hemorrhoids if they are tender and swollen. Using ice packs between sitz baths may be helpful.   Put ice in a plastic bag.   Place a towel between your skin and the bag.   Leave the ice on for 15-20 minutes, 3-4 times a day.   Do not use a donut-shaped pillow or sit on the toilet for long periods. This increases blood pooling and pain.  SEEK MEDICAL CARE IF:  You have increasing pain and swelling that is not controlled by treatment or medicine.  You have uncontrolled bleeding.  You have difficulty or you are unable to have a bowel movement.  You have pain or inflammation outside the area of the hemorrhoids. MAKE SURE YOU:  Understand these instructions.  Will watch your condition.  Will get help right away if you are not doing well or get worse.   This information is not intended to replace advice given to you by your health care provider. Make sure you discuss any questions you have with your health care provider.   Document Released: 10/05/2000 Document Revised: 09/24/2012 Document Reviewed: 08/12/2012 Elsevier Interactive Patient Education 2016 Elsevier Inc.  

## 2016-08-01 NOTE — Progress Notes (Signed)
[id number] 5070w4d Estimated Date of Delivery: 10/06/16  Last menstrual period 12/13/2015.   BP weight and urine results all reviewed and noted.  Please refer to the obstetrical flow sheet for the fundal height and fetal heart rate documentation:  Patient reports good fetal movement, denies any bleeding and no rupture of membranes symptoms or regular contractions. Patient c/o hemorrhoids.  Has tried OTC meds.  Two 1-2cm non thrombosed external hemorrhoids.   All questions were answered.  Orders Placed This Encounter  Procedures  . POCT urinalysis dipstick    Plan:  Continued routine obstetrical care, rx proctocream; ice/donut pillow. Thrombosed precautions given  Return in about 2 weeks (around 08/15/2016) for LROB.

## 2016-08-15 ENCOUNTER — Encounter: Payer: Self-pay | Admitting: Advanced Practice Midwife

## 2016-08-15 ENCOUNTER — Ambulatory Visit (INDEPENDENT_AMBULATORY_CARE_PROVIDER_SITE_OTHER): Payer: Medicaid Other | Admitting: Advanced Practice Midwife

## 2016-08-15 VITALS — BP 90/58 | HR 78 | Wt 186.0 lb

## 2016-08-15 DIAGNOSIS — Z3A33 33 weeks gestation of pregnancy: Secondary | ICD-10-CM

## 2016-08-15 DIAGNOSIS — Z1389 Encounter for screening for other disorder: Secondary | ICD-10-CM

## 2016-08-15 DIAGNOSIS — Z3482 Encounter for supervision of other normal pregnancy, second trimester: Secondary | ICD-10-CM

## 2016-08-15 DIAGNOSIS — Z331 Pregnant state, incidental: Secondary | ICD-10-CM

## 2016-08-15 LAB — POCT URINALYSIS DIPSTICK
Blood, UA: NEGATIVE
Glucose, UA: NEGATIVE
KETONES UA: NEGATIVE
Leukocytes, UA: NEGATIVE
Nitrite, UA: NEGATIVE
PROTEIN UA: NEGATIVE

## 2016-08-15 NOTE — Progress Notes (Signed)
[id number] 6847w4d Estimated Date of Delivery: 10/06/16  Blood pressure (!) 90/58, pulse 78, weight 186 lb (84.4 kg), last menstrual period 12/13/2015.   BP weight and urine results all reviewed and noted.  Please refer to the obstetrical flow sheet for the fundal height and fetal heart rate documentation:  Patient reports good fetal movement, denies any bleeding and no rupture of membranes symptoms or regular contractions. Patient is without complaints. All questions were answered.  Orders Placed This Encounter  Procedures  . POCT urinalysis dipstick    Plan:  Continued routine obstetrical care,   Return in about 2 weeks (around 08/29/2016) for LROB.

## 2016-08-30 ENCOUNTER — Ambulatory Visit (INDEPENDENT_AMBULATORY_CARE_PROVIDER_SITE_OTHER): Payer: Medicaid Other | Admitting: Women's Health

## 2016-08-30 ENCOUNTER — Encounter: Payer: Self-pay | Admitting: Women's Health

## 2016-08-30 VITALS — BP 100/54 | HR 90 | Wt 189.0 lb

## 2016-08-30 DIAGNOSIS — Z331 Pregnant state, incidental: Secondary | ICD-10-CM

## 2016-08-30 DIAGNOSIS — Z1389 Encounter for screening for other disorder: Secondary | ICD-10-CM

## 2016-08-30 DIAGNOSIS — Z3483 Encounter for supervision of other normal pregnancy, third trimester: Secondary | ICD-10-CM

## 2016-08-30 DIAGNOSIS — Z3A35 35 weeks gestation of pregnancy: Secondary | ICD-10-CM

## 2016-08-30 LAB — POCT URINALYSIS DIPSTICK
Blood, UA: NEGATIVE
GLUCOSE UA: NEGATIVE
KETONES UA: NEGATIVE
Nitrite, UA: NEGATIVE

## 2016-08-30 NOTE — Patient Instructions (Signed)
Call the office ([phone]) or go to [facility] if:  You begin to have strong, frequent contractions  Your water breaks.  Sometimes it is a big gush of fluid, sometimes it is just a trickle that keeps getting your panties wet or running down your legs  You have vaginal bleeding.  It is normal to have a small amount of spotting if your cervix was checked.   You don't feel your baby moving like normal.  If you don't, get you something to eat and drink and lay down and focus on feeling your baby move.  You should feel at least 10 movements in 2 hours.  If you don't, you should call the office or go to [facility].    Preterm Labor Information Preterm labor is when labor starts at less than 37 weeks of pregnancy. The normal length of a pregnancy is 39 to 41 weeks. CAUSES Often, there is no identifiable underlying cause as to why a woman goes into preterm labor. One of the most common known causes of preterm labor is infection. Infections of the uterus, cervix, vagina, amniotic sac, bladder, kidney, or even the lungs (pneumonia) can cause labor to start. Other suspected causes of preterm labor include:   Urogenital infections, such as yeast infections and bacterial vaginosis.   Uterine abnormalities (uterine shape, uterine septum, fibroids, or bleeding from the placenta).   A cervix that has been operated on (it may fail to stay closed).   Malformations in the fetus.   Multiple gestations (twins, triplets, and so on).   Breakage of the amniotic sac.  RISK FACTORS  Having a previous history of preterm labor.   Having premature rupture of membranes (PROM).   Having a placenta that covers the opening of the cervix (placenta previa).   Having a placenta that separates from the uterus (placental abruption).   Having a cervix that is too weak to hold the fetus in the uterus (incompetent cervix).   Having too much fluid in the amniotic sac (polyhydramnios).   Taking  illegal drugs or smoking while pregnant.   Not gaining enough weight while pregnant.   Being younger than 18 and older than 29 years old.   Having a low socioeconomic status.   Being African American. SYMPTOMS Signs and symptoms of preterm labor include:   Menstrual-like cramps, abdominal pain, or back pain.  Uterine contractions that are regular, as frequent as six in an hour, regardless of their intensity (may be mild or painful).  Contractions that start on the top of the uterus and spread down to the lower abdomen and back.   A sense of increased pelvic pressure.   A watery or bloody mucus discharge that comes from the vagina.  TREATMENT Depending on the length of the pregnancy and other circumstances, your health care provider may suggest bed rest. If necessary, there are medicines that can be given to stop contractions and to mature the fetal lungs. If labor happens before 34 weeks of pregnancy, a prolonged hospital stay may be recommended. Treatment depends on the condition of both you and the fetus.  WHAT SHOULD YOU DO IF YOU THINK YOU ARE IN PRETERM LABOR? Call your health care provider right away. You will need to go to the hospital to get checked immediately. HOW CAN YOU PREVENT PRETERM LABOR IN FUTURE PREGNANCIES? You should:   Stop smoking if you smoke.  Maintain healthy weight gain and avoid chemicals and drugs that are not necessary.  Be watchful for   any type of infection.  Inform your health care provider if you have a known history of preterm labor.   This information is not intended to replace advice given to you by your health care provider. Make sure you discuss any questions you have with your health care provider.   Document Released: 12/29/2003 Document Revised: 06/10/2013 Document Reviewed: 11/10/2012 Elsevier Interactive Patient Education 2016 Elsevier Inc.  

## 2016-08-30 NOTE — Progress Notes (Signed)
Low-risk OB appointment [id number] 5638w5d Estimated Date of Delivery: 10/06/16 BP (!) 100/54   Pulse 90   Wt 189 lb (85.7 kg)   LMP 12/13/2015 (Approximate)   BMI 35.71 kg/m   BP, weight, and urine reviewed.  Refer to obstetrical flow sheet for FH & FHR.  Reports good fm.  Denies regular uc's, lof, vb, or uti s/s. No complaints. Reviewed ptl s/s, fkc. Plan:  Continue routine obstetrical care  F/U in 2wks for OB appointment and gbs

## 2016-09-12 ENCOUNTER — Encounter: Payer: Self-pay | Admitting: Advanced Practice Midwife

## 2016-09-12 ENCOUNTER — Ambulatory Visit (INDEPENDENT_AMBULATORY_CARE_PROVIDER_SITE_OTHER): Payer: Medicaid Other | Admitting: Advanced Practice Midwife

## 2016-09-12 VITALS — BP 100/62 | HR 64 | Wt 188.0 lb

## 2016-09-12 DIAGNOSIS — Z331 Pregnant state, incidental: Secondary | ICD-10-CM

## 2016-09-12 DIAGNOSIS — Z1389 Encounter for screening for other disorder: Secondary | ICD-10-CM

## 2016-09-12 DIAGNOSIS — Z3A36 36 weeks gestation of pregnancy: Secondary | ICD-10-CM

## 2016-09-12 DIAGNOSIS — Z3483 Encounter for supervision of other normal pregnancy, third trimester: Secondary | ICD-10-CM

## 2016-09-12 LAB — POCT URINALYSIS DIPSTICK
GLUCOSE UA: NEGATIVE
KETONES UA: NEGATIVE
LEUKOCYTES UA: NEGATIVE
NITRITE UA: NEGATIVE
Protein, UA: NEGATIVE
RBC UA: NEGATIVE

## 2016-09-12 LAB — OB RESULTS CONSOLE GBS: GBS: NEGATIVE

## 2016-09-12 NOTE — Patient Instructions (Signed)

## 2016-09-12 NOTE — Progress Notes (Signed)
[id number] 5218w4d Estimated Date of Delivery: 10/06/16  Blood pressure 100/62, pulse 64, weight 188 lb (85.3 kg), last menstrual period 12/13/2015.   BP weight and urine results all reviewed and noted.  Please refer to the obstetrical flow sheet for the fundal height and fetal heart rate documentation:  Patient reports good fetal movement, denies any bleeding and no rupture of membranes symptoms or regular contractions. Patient is without complaints. All questions were answered.  Orders Placed This Encounter  Procedures  . Culture, beta strep (group b only)  . GC/Chlamydia Probe Amp  . POCT urinalysis dipstick    Plan:  Continued routine obstetrical care,   Return in about 1 week (around 09/19/2016) for LROB.

## 2016-09-15 LAB — GC/CHLAMYDIA PROBE AMP
Chlamydia trachomatis, NAA: NEGATIVE
Neisseria gonorrhoeae by PCR: NEGATIVE

## 2016-09-17 LAB — CULTURE, BETA STREP (GROUP B ONLY): STREP GP B CULTURE: NEGATIVE

## 2016-09-20 ENCOUNTER — Encounter: Payer: Self-pay | Admitting: Women's Health

## 2016-09-20 ENCOUNTER — Ambulatory Visit (INDEPENDENT_AMBULATORY_CARE_PROVIDER_SITE_OTHER): Payer: Medicaid Other | Admitting: Women's Health

## 2016-09-20 VITALS — BP 112/50 | HR 94 | Wt 190.5 lb

## 2016-09-20 DIAGNOSIS — Z3A38 38 weeks gestation of pregnancy: Secondary | ICD-10-CM

## 2016-09-20 DIAGNOSIS — Z331 Pregnant state, incidental: Secondary | ICD-10-CM

## 2016-09-20 DIAGNOSIS — Z1389 Encounter for screening for other disorder: Secondary | ICD-10-CM

## 2016-09-20 DIAGNOSIS — Z3483 Encounter for supervision of other normal pregnancy, third trimester: Secondary | ICD-10-CM

## 2016-09-20 LAB — POCT URINALYSIS DIPSTICK
Blood, UA: NEGATIVE
Glucose, UA: NEGATIVE
Nitrite, UA: NEGATIVE
PROTEIN UA: NEGATIVE

## 2016-09-20 NOTE — Progress Notes (Signed)
Low-risk OB appointment [id number] 7931w5d Estimated Date of Delivery: 10/06/16 BP (!) 112/50   Pulse 94   Wt 190 lb 8 oz (86.4 kg)   LMP 12/13/2015 (Approximate)   BMI 35.99 kg/m   BP, weight, and urine reviewed.  Refer to obstetrical flow sheet for FH & FHR.  Reports good fm.  Denies regular uc's, lof, vb, or uti s/s. No complaints. UCs ~ 5x/hr.  SVE per request: tight 3/50/-2, vtx Reviewed labor s/s, fkc. Plan:  Continue routine obstetrical care  F/U in 1wk for OB appointment

## 2016-09-20 NOTE — Patient Instructions (Signed)
Call the office ([phone]) or go to [facility] if:  You begin to have strong, frequent contractions  Your water breaks.  Sometimes it is a big gush of fluid, sometimes it is just a trickle that keeps getting your panties wet or running down your legs  You have vaginal bleeding.  It is normal to have a small amount of spotting if your cervix was checked.   You don't feel your baby moving like normal.  If you don't, get you something to eat and drink and lay down and focus on feeling your baby move.  You should feel at least 10 movements in 2 hours.  If you don't, you should call the office or go to [facility].     Braxton Hicks Contractions Contractions of the uterus can occur throughout pregnancy. Contractions are not always a sign that you are in labor.  WHAT ARE BRAXTON HICKS CONTRACTIONS?  Contractions that occur before labor are called Braxton Hicks contractions, or false labor. Toward the end of pregnancy (32-34 weeks), these contractions can develop more often and may become more forceful. This is not true labor because these contractions do not result in opening (dilatation) and thinning of the cervix. They are sometimes difficult to tell apart from true labor because these contractions can be forceful and people have different pain tolerances. You should not feel embarrassed if you go to the hospital with false labor. Sometimes, the only way to tell if you are in true labor is for your health care provider to look for changes in the cervix. If there are no prenatal problems or other health problems associated with the pregnancy, it is completely safe to be sent home with false labor and await the onset of true labor. HOW CAN YOU TELL THE DIFFERENCE BETWEEN TRUE AND FALSE LABOR? False Labor   The contractions of false labor are usually shorter and not as hard as those of true labor.   The contractions are usually irregular.   The contractions are often felt in the front of  the lower abdomen and in the groin.   The contractions may go away when you walk around or change positions while lying down.   The contractions get weaker and are shorter lasting as time goes on.   The contractions do not usually become progressively stronger, regular, and closer together as with true labor.  True Labor   Contractions in true labor last 30-70 seconds, become very regular, usually become more intense, and increase in frequency.   The contractions do not go away with walking.   The discomfort is usually felt in the top of the uterus and spreads to the lower abdomen and low back.   True labor can be determined by your health care provider with an exam. This will show that the cervix is dilating and getting thinner.  WHAT TO REMEMBER  Keep up with your usual exercises and follow other instructions given by your health care provider.   Take medicines as directed by your health care provider.   Keep your regular prenatal appointments.   Eat and drink lightly if you think you are going into labor.   If Braxton Hicks contractions are making you uncomfortable:   Change your position from lying down or resting to walking, or from walking to resting.   Sit and rest in a tub of warm water.   Drink 2-3 glasses of water. Dehydration may cause these contractions.   Do slow and deep breathing several   times an hour.  WHEN SHOULD I SEEK IMMEDIATE MEDICAL CARE? Seek immediate medical care if:  Your contractions become stronger, more regular, and closer together.   You have fluid leaking or gushing from your vagina.   You have a fever.   You pass blood-tinged mucus.   You have vaginal bleeding.   You have continuous abdominal pain.   You have low back pain that you never had before.   You feel your baby's head pushing down and causing pelvic pressure.   Your baby is not moving as much as it used to.  This information is not intended to  replace advice given to you by your health care provider. Make sure you discuss any questions you have with your health care provider. Document Released: 10/08/2005 Document Revised: 01/30/2016 Document Reviewed: 07/20/2013 Elsevier Interactive Patient Education  2017 Elsevier Inc.  

## 2016-09-25 ENCOUNTER — Inpatient Hospital Stay (HOSPITAL_COMMUNITY): Payer: Medicaid Other | Admitting: Anesthesiology

## 2016-09-25 ENCOUNTER — Encounter (HOSPITAL_COMMUNITY): Payer: Self-pay

## 2016-09-25 ENCOUNTER — Inpatient Hospital Stay (HOSPITAL_COMMUNITY)
Admission: AD | Admit: 2016-09-25 | Discharge: 2016-09-27 | DRG: 775 | Disposition: A | Discharge status: Home / Self Care | Payer: Medicaid Other | Source: Ambulatory Visit | Attending: Obstetrics & Gynecology | Admitting: Obstetrics & Gynecology

## 2016-09-25 DIAGNOSIS — O4292 Full-term premature rupture of membranes, unspecified as to length of time between rupture and onset of labor: Secondary | ICD-10-CM | POA: Diagnosis present

## 2016-09-25 DIAGNOSIS — Z833 Family history of diabetes mellitus: Secondary | ICD-10-CM

## 2016-09-25 DIAGNOSIS — Z3A38 38 weeks gestation of pregnancy: Secondary | ICD-10-CM

## 2016-09-25 DIAGNOSIS — Z3483 Encounter for supervision of other normal pregnancy, third trimester: Secondary | ICD-10-CM

## 2016-09-25 LAB — CBC
HCT: 35.6 % — ABNORMAL LOW (ref 36.0–46.0)
Hemoglobin: 12 g/dL (ref 12.0–15.0)
MCH: 27.5 pg (ref 26.0–34.0)
MCHC: 33.7 g/dL (ref 30.0–36.0)
MCV: 81.7 fL (ref 78.0–100.0)
PLATELETS: 154 10*3/uL (ref 150–400)
RBC: 4.36 MIL/uL (ref 3.87–5.11)
RDW: 13.4 % (ref 11.5–15.5)
WBC: 6 10*3/uL (ref 4.0–10.5)

## 2016-09-25 LAB — TYPE AND SCREEN
ABO/RH(D): A POS
Antibody Screen: NEGATIVE

## 2016-09-25 LAB — POCT FERN TEST: POCT Fern Test: POSITIVE

## 2016-09-25 LAB — ABO/RH: ABO/RH(D): A POS

## 2016-09-25 MED ORDER — LIDOCAINE HCL (PF) 1 % IJ SOLN
30.0000 mL | INTRAMUSCULAR | Status: DC | PRN
  Administered 2016-09-25: 30 mL via SUBCUTANEOUS
  Filled 2016-09-25: qty 30

## 2016-09-25 MED ORDER — IBUPROFEN 600 MG PO TABS
600.0000 mg | ORAL_TABLET | Freq: Four times a day (QID) | ORAL | Status: DC
  Administered 2016-09-25 – 2016-09-27 (×6): 600 mg via ORAL
  Filled 2016-09-25 (×6): qty 1

## 2016-09-25 MED ORDER — ACETAMINOPHEN 325 MG PO TABS: 650.0000 mg | ORAL_TABLET | ORAL | Status: DC | PRN

## 2016-09-25 MED ORDER — DIPHENHYDRAMINE HCL 50 MG/ML IJ SOLN: 12.5000 mg | INTRAMUSCULAR | Status: DC | PRN

## 2016-09-25 MED ORDER — TERBUTALINE SULFATE 1 MG/ML IJ SOLN
0.2500 mg | Freq: Once | INTRAMUSCULAR | Status: DC | PRN
  Filled 2016-09-25: qty 1

## 2016-09-25 MED ORDER — FENTANYL 2.5 MCG/ML BUPIVACAINE 1/10 % EPIDURAL INFUSION (WH - ANES): 14.0000 mL/h | INTRAMUSCULAR | Status: DC | PRN

## 2016-09-25 MED ORDER — ONDANSETRON HCL 4 MG/2ML IJ SOLN: 4.0000 mg | Freq: Four times a day (QID) | INTRAMUSCULAR | Status: DC | PRN

## 2016-09-25 MED ORDER — BENZOCAINE-MENTHOL 20-0.5 % EX AERO: 1.0000 "application " | INHALATION_SPRAY | CUTANEOUS | Status: DC | PRN

## 2016-09-25 MED ORDER — EPHEDRINE 5 MG/ML INJ
10.0000 mg | INTRAVENOUS | Status: DC | PRN
  Filled 2016-09-25: qty 4

## 2016-09-25 MED ORDER — LIDOCAINE HCL (PF) 1 % IJ SOLN
INTRAMUSCULAR | Status: DC | PRN
  Administered 2016-09-25 (×2): 4 mL

## 2016-09-25 MED ORDER — PHENYLEPHRINE 40 MCG/ML (10ML) SYRINGE FOR IV PUSH (FOR BLOOD PRESSURE SUPPORT)
80.0000 ug | PREFILLED_SYRINGE | INTRAVENOUS | Status: DC | PRN
  Filled 2016-09-25: qty 10
  Filled 2016-09-25: qty 5

## 2016-09-25 MED ORDER — DIBUCAINE 1 % RE OINT: 1.0000 "application " | TOPICAL_OINTMENT | RECTAL | Status: DC | PRN

## 2016-09-25 MED ORDER — MEASLES, MUMPS & RUBELLA VAC ~~LOC~~ INJ: 0.5000 mL | INJECTION | Freq: Once | SUBCUTANEOUS | Status: DC

## 2016-09-25 MED ORDER — ACETAMINOPHEN 325 MG PO TABS
650.0000 mg | ORAL_TABLET | ORAL | Status: DC | PRN
  Administered 2016-09-26 (×2): 650 mg via ORAL
  Filled 2016-09-25 (×3): qty 2

## 2016-09-25 MED ORDER — OXYTOCIN BOLUS FROM INFUSION
500.0000 mL | Freq: Once | INTRAVENOUS | Status: AC
  Administered 2016-09-25: 500 mL via INTRAVENOUS

## 2016-09-25 MED ORDER — TETANUS-DIPHTH-ACELL PERTUSSIS 5-2.5-18.5 LF-MCG/0.5 IM SUSP
0.5000 mL | Freq: Once | INTRAMUSCULAR | Status: AC
  Administered 2016-09-27: 0.5 mL via INTRAMUSCULAR

## 2016-09-25 MED ORDER — COCONUT OIL OIL: 1.0000 "application " | TOPICAL_OIL | Status: DC | PRN

## 2016-09-25 MED ORDER — METHYLERGONOVINE MALEATE 0.2 MG/ML IJ SOLN: 0.2000 mg | INTRAMUSCULAR | Status: DC | PRN

## 2016-09-25 MED ORDER — METHYLERGONOVINE MALEATE 0.2 MG PO TABS: 0.2000 mg | ORAL_TABLET | ORAL | Status: DC | PRN

## 2016-09-25 MED ORDER — OXYCODONE HCL 5 MG PO TABS
5.0000 mg | ORAL_TABLET | ORAL | Status: DC | PRN
  Administered 2016-09-26: 5 mg via ORAL
  Filled 2016-09-25: qty 1

## 2016-09-25 MED ORDER — OXYCODONE-ACETAMINOPHEN 5-325 MG PO TABS: 1.0000 | ORAL_TABLET | ORAL | Status: DC | PRN

## 2016-09-25 MED ORDER — OXYCODONE-ACETAMINOPHEN 5-325 MG PO TABS: 2.0000 | ORAL_TABLET | ORAL | Status: DC | PRN

## 2016-09-25 MED ORDER — ONDANSETRON HCL 4 MG PO TABS: 4.0000 mg | ORAL_TABLET | ORAL | Status: DC | PRN

## 2016-09-25 MED ORDER — ZOLPIDEM TARTRATE 5 MG PO TABS: 5.0000 mg | ORAL_TABLET | Freq: Every evening | ORAL | Status: DC | PRN

## 2016-09-25 MED ORDER — SOD CITRATE-CITRIC ACID 500-334 MG/5ML PO SOLN: 30.0000 mL | ORAL | Status: DC | PRN

## 2016-09-25 MED ORDER — FENTANYL CITRATE (PF) 100 MCG/2ML IJ SOLN: 50.0000 ug | INTRAMUSCULAR | Status: DC | PRN

## 2016-09-25 MED ORDER — PHENYLEPHRINE 40 MCG/ML (10ML) SYRINGE FOR IV PUSH (FOR BLOOD PRESSURE SUPPORT)
80.0000 ug | PREFILLED_SYRINGE | INTRAVENOUS | Status: DC | PRN
  Filled 2016-09-25: qty 5

## 2016-09-25 MED ORDER — PRENATAL MULTIVITAMIN CH
1.0000 | ORAL_TABLET | Freq: Every day | ORAL | Status: DC
  Administered 2016-09-26: 1 via ORAL
  Filled 2016-09-25: qty 1

## 2016-09-25 MED ORDER — WITCH HAZEL-GLYCERIN EX PADS: 1.0000 "application " | MEDICATED_PAD | CUTANEOUS | Status: DC | PRN

## 2016-09-25 MED ORDER — ONDANSETRON HCL 4 MG/2ML IJ SOLN
4.0000 mg | INTRAMUSCULAR | Status: DC | PRN
Start: 2016-09-25 — End: 2016-09-27

## 2016-09-25 MED ORDER — SENNOSIDES-DOCUSATE SODIUM 8.6-50 MG PO TABS
2.0000 | ORAL_TABLET | ORAL | Status: DC
  Administered 2016-09-25 – 2016-09-26 (×2): 2 via ORAL
  Filled 2016-09-25 (×2): qty 2

## 2016-09-25 MED ORDER — FENTANYL 2.5 MCG/ML BUPIVACAINE 1/10 % EPIDURAL INFUSION (WH - ANES)
14.0000 mL/h | INTRAMUSCULAR | Status: DC | PRN
  Administered 2016-09-25 (×2): 14 mL/h via EPIDURAL
  Filled 2016-09-25 (×2): qty 100

## 2016-09-25 MED ORDER — OXYTOCIN 40 UNITS IN LACTATED RINGERS INFUSION - SIMPLE MED: 1.0000 m[IU]/min | INTRAVENOUS | Status: DC

## 2016-09-25 MED ORDER — LACTATED RINGERS IV SOLN
500.0000 mL | Freq: Once | INTRAVENOUS | Status: AC
Start: 2016-09-25 — End: 2016-09-25
  Administered 2016-09-25: 500 mL via INTRAVENOUS

## 2016-09-25 MED ORDER — LACTATED RINGERS IV SOLN
500.0000 mL | INTRAVENOUS | Status: DC | PRN
  Administered 2016-09-25: 1000 mL via INTRAVENOUS

## 2016-09-25 MED ORDER — OXYTOCIN 40 UNITS IN LACTATED RINGERS INFUSION - SIMPLE MED
2.5000 [IU]/h | INTRAVENOUS | Status: DC
  Filled 2016-09-25: qty 1000

## 2016-09-25 MED ORDER — SIMETHICONE 80 MG PO CHEW
80.0000 mg | CHEWABLE_TABLET | ORAL | Status: DC | PRN
Start: 2016-09-25 — End: 2016-09-27

## 2016-09-25 MED ORDER — LACTATED RINGERS IV SOLN
INTRAVENOUS | Status: DC
  Administered 2016-09-25 (×2): via INTRAVENOUS

## 2016-09-25 MED ORDER — DIPHENHYDRAMINE HCL 25 MG PO CAPS: 25.0000 mg | ORAL_CAPSULE | Freq: Four times a day (QID) | ORAL | Status: DC | PRN

## 2016-09-25 MED ORDER — OXYCODONE HCL 5 MG PO TABS: 10.0000 mg | ORAL_TABLET | ORAL | Status: DC | PRN

## 2016-09-25 NOTE — H&P (Signed)
LABOR AND DELIVERY ADMISSION HISTORY AND PHYSICAL NOTE  Sydney Alexander is a 30 y.o. female G3P2002 with IUP at 2435w3d by 7 wk US presenting for SROM/SOL.   She reports positive fetal movement. She admits to leakage of fluid prior to arrival to MAU/ denies vaginal bleeding.  Prenatal History/Complications:  Past Medical History: Past Medical History:  Diagnosis Date  . Abnormal pap   . BV (bacterial vaginosis)   . Contraceptive management 03/10/2015  . Cramping affecting pregnancy, antepartum 03/26/2016  . Encounter for IUD removal 03/10/2015  . Vaginal discharge 03/26/2016  . Vaginal irritation 03/26/2016  . Vaginal Pap smear, abnormal     Past Surgical History: Past Surgical History:  Procedure Laterality Date  . NO PAST SURGERIES      Obstetrical History: OB History    Gravida Para Term Preterm AB Living   3 2 2     2    SAB TAB Ectopic Multiple Live Births           2      Social History: Social History   Social History  . Marital status: Single    Spouse name: N/A  . Number of children: N/A  . Years of education: N/A   Social History Main Topics  . Smoking status: Never Smoker  . Smokeless tobacco: Never Used  . Alcohol use No  . Drug use: No  . Sexual activity: Yes    Birth control/ protection: None   Other Topics Concern  . None   Social History Narrative  . None    Family History: Family History  Problem Relation Age of Onset  . Cancer Maternal Grandmother   . Thyroid disease Mother   . Cancer Paternal Aunt   . Diabetes Other     lots of aunts and uncles on both sides    Allergies: No Known Allergies  Prescriptions Prior to Admission  Medication Sig Dispense Refill Last Dose  . CALCIUM PO Take 270 mg by mouth 4 (four) times daily as needed (for heartburn).   09/25/2016  . Pediatric Multivit-Minerals-C (FLINTSTONES GUMMIES PLUS PO) Take 2 each by mouth daily.   09/25/2016     Review of Systems   All systems reviewed and negative except as  stated in HPI  Blood pressure 126/70, pulse 80, temperature 98 F (36.7 C), temperature source Oral, resp. rate 18, height 5\' 1"  (1.549 m), weight 190 lb (86.2 kg), last menstrual period 12/13/2015, SpO2 99 %. General appearance: alert, cooperative and appears stated age Lungs: clear to auscultation bilaterally Heart: regular rate and rhythm Abdomen: soft, non-tender; bowel sounds normal Extremities: No calf swelling or tenderness Presentation: cephalic Fetal monitoring: Cat I tracing Uterine activity: q7-8 min//regular Dilation: 4 Effacement (%): 70 Station: -2 Exam by:: cwicker,rnc   Prenatal labs: ABO, Rh: A/Positive/-- (05/15 1327) Antibody: Negative (09/20 0908) Rubella: !Error! RPR: Non Reactive (09/20 0908)  HBsAg: Negative (05/15 1327)  HIV: Non Reactive (09/20 0908)  GBS: Negative (11/22 0000)  2 hr GTT: 77/163/92 Genetic screening:  Neg NT/IT Anatomy US: Lt EICF,otherwise normal  Prenatal Transfer Tool  Maternal Diabetes: No Genetic Screening: Normal Maternal Ultrasounds/Referrals: Normal Fetal Ultrasounds or other Referrals:  None Maternal Substance Abuse:  No Significant Maternal Medications:  None Significant Maternal Lab Results: None  Results for orders placed or performed during the hospital encounter of 09/25/16 (from the past 24 hour(s))  Fern Test   Collection Time: 09/25/16 10:33 AM  Result Value Ref Range   POCT BartlettFern Test  Positive = ruptured amniotic membanes     Patient Active Problem List   Diagnosis Date Noted  . Fetal echogenic intracardiac focus on prenatal ultrasound 05/22/2016  . BV (bacterial vaginosis) 03/26/2016  . ASCUS with positive high risk HPV cervical 03/13/2016  . Supervision of normal pregnancy 03/05/2016    Assessment: Sydney JarredJaime S Greenberger is a [age] y.o. G3P2002 at 5562w3d here for SROM/SOL  #Labor:Anticipate SVD. Pitocin for augmentation #Pain: IV pain meds prn/epidural on request #FWB: Cat I tracing #ID:  GBS negative #MOF:  Breast #MOC:Interval BTL #Circ:  Outpatient  WALLACE, NOAH I, DO PGY-3 09/25/2016, 10:50 AM   OB FELLOW HISTORY AND PHYSICAL ATTESTATION  I have seen and examined this patient; I agree with above documentation in the resident's note.    Cherrie Gauzeoah B Wouk 09/25/2016, 1:21 PM

## 2016-09-25 NOTE — Progress Notes (Signed)
S: Patient seen & examined for progress of labor. Patient comfortable with epidural.No PIH symptoms   O:  Vitals:   09/25/16 1630 09/25/16 1645 09/25/16 1655 09/25/16 1710  BP:  121/77    Pulse:  100    Resp: 16  18 16   Temp:      TempSrc:      SpO2:      Weight:      Height:        Dilation: 9 Effacement (%): 100 Cervical Position: Middle Station: -1 Presentation: Vertex Exam by:: foley,rn   FHT: Cat I tracing TOCO: q2-773min   A/P: Continue expectant management Anticipate SVD  Deforest HoylesNoah Wallace, DO PGY-3

## 2016-09-25 NOTE — Anesthesia Pain Management Evaluation Note (Signed)
  CRNA Pain Management Visit Note  Patient: Sydney Alexander, 30 y.o., female  "Hello [facility]. We have an anesthesia team available at all times to provide care throughout the hospital, including epidural management and anesthesia for C-section. I don't know your plan for the delivery whether it a natural birth, water birth, IV sedation, nitrous supplementation, doula or epidural, but we want to meet your pain goals."   1.Was your pain managed to your expectations on prior hospitalizations?   Yes   2.What is your expectation for pain management during this hospitalization?     Epidural  3.How can we help you reach that goal?Epidural in place and working well.  Record the patient's initial score and the patient's pain goal.   Pain: 0  Pain Goal: 4 [facility] wants you to be able to say your pain was always managed very well.  Kiptyn Rafuse 09/25/2016

## 2016-09-25 NOTE — Lactation Note (Signed)
This note was copied from a baby's chart. Lactation Consultation Note  Patient Name: Sydney Kendrick FriesJaime Ariel WUJWJ'XToday's Date: 09/25/2016 Reason for consult: Initial assessment Baby at 4 hr of life. Upon entry baby was sleeping sts with mom. She stated baby had just bf. Experienced bf mom reports latching is going well. She denies breast or nipple pain, voiced no concerns. She bf her oldest child until she self weaned. She bf her 2nd child until her PPD got so bad she could not "do anything any more". Discussed the risk of PPD again and to seek help early. Discussed baby behavior, feeding frequency, baby belly size, voids, wt loss, breast changes, and nipple care. She stated she can manually express and has spoon in room. Given lactation handouts. Aware of OP services and support group.    Maternal Data Has patient been taught Hand Expression?: Yes Does the patient have breastfeeding experience prior to this delivery?: Yes  Feeding Feeding Type: Breast Fed Length of feed: 15 min  LATCH Score/Interventions Latch: Grasps breast easily, tongue down, lips flanged, rhythmical sucking.  Audible Swallowing: A few with stimulation Intervention(s): Skin to skin;Hand expression;Alternate breast massage  Type of Nipple: Everted at rest and after stimulation  Comfort (Breast/Nipple): Soft / non-tender     Hold (Positioning): Assistance needed to correctly position infant at breast and maintain latch. Intervention(s): Position options;Support Pillows;Breastfeeding basics reviewed;Skin to skin  LATCH Score: 8  Lactation Tools Discussed/Used WIC Program: No   Consult Status Consult Status: Follow-up Date: 09/26/16 Follow-up type: In-patient    Sydney Alexander 09/25/2016, 10:22 PM

## 2016-09-25 NOTE — Anesthesia Procedure Notes (Signed)
Epidural Patient location during procedure: OB  Staffing Anesthesiologist: Anaiya Wisinski Performed: anesthesiologist   Preanesthetic Checklist Completed: patient identified, pre-op evaluation, timeout performed, IV checked, risks and benefits discussed and monitors and equipment checked  Epidural Patient position: sitting Prep: site prepped and draped and DuraPrep Patient monitoring: heart rate Approach: midline Location: L3-L4 Injection technique: LOR air and LOR saline  Needle:  Needle type: Tuohy  Needle gauge: 17 G Needle length: 9 cm Needle insertion depth: 8 cm Catheter type: closed end flexible Catheter size: 19 Gauge Catheter at skin depth: 14 cm Test dose: negative  Assessment Sensory level: T8 Events: blood not aspirated, injection not painful, no injection resistance, negative IV test and no paresthesia  Additional Notes Reason for block:procedure for pain     

## 2016-09-25 NOTE — MAU Note (Signed)
Pt c/o contractions that have been ongoing since last night. Pt states she felt like her water broke at 1000 with clear fluid. Pt states she has had some spotting since the water broke. Pt states the baby has been moving normally. Pt denies problems with her pregnancy.

## 2016-09-25 NOTE — Anesthesia Preprocedure Evaluation (Signed)
Anesthesia Evaluation  Patient identified by MRN, date of birth, ID band Patient awake    Reviewed: Allergy & Precautions, NPO status , Patient's Chart, lab work & pertinent test results  Airway Mallampati: II  TM Distance: >3 FB Neck ROM: Full    Dental no notable dental hx.    Pulmonary neg pulmonary ROS,    Pulmonary exam normal breath sounds clear to auscultation       Cardiovascular negative cardio ROS Normal cardiovascular exam Rhythm:Regular Rate:Normal     Neuro/Psych negative neurological ROS  negative psych ROS   GI/Hepatic negative GI ROS, Neg liver ROS,   Endo/Other  negative endocrine ROS  Renal/GU negative Renal ROS     Musculoskeletal negative musculoskeletal ROS (+)   Abdominal (+) + obese,   Peds  Hematology negative hematology ROS (+)   Anesthesia Other Findings   Reproductive/Obstetrics (+) Pregnancy                             Anesthesia Physical Anesthesia Plan  ASA: II  Anesthesia Plan: Epidural   Post-op Pain Management:    Induction:   Airway Management Planned:   Additional Equipment:   Intra-op Plan:   Post-operative Plan:   Informed Consent: I have reviewed the patients History and Physical, chart, labs and discussed the procedure including the risks, benefits and alternatives for the proposed anesthesia with the patient or authorized representative who has indicated his/her understanding and acceptance.     Plan Discussed with:   Anesthesia Plan Comments:         Anesthesia Quick Evaluation  

## 2016-09-26 ENCOUNTER — Encounter (HOSPITAL_COMMUNITY): Payer: Self-pay | Admitting: Anesthesiology

## 2016-09-26 LAB — RPR: RPR Ser Ql: NONREACTIVE

## 2016-09-26 MED ORDER — IBUPROFEN 600 MG PO TABS: 600.0000 mg | ORAL_TABLET | Freq: Four times a day (QID) | ORAL | 0 refills | Status: DC | PRN

## 2016-09-26 NOTE — Lactation Note (Signed)
This note was copied from a baby's chart. Lactation Consultation Note . Infant just finishing bath . Explained to mother that infants are usually sleepy after a bath. Assist mother with infant STS in cross cradle hold. Infant opened his mouth and held nipple. He made no attempt to feed. Advised mother to hold STS. Informed mother of cluster feeding that this is a normal newborn behavior. Mother receptive to teaching .Marland Kitchen.   Patient Name: Sydney Alexander ZOXWR'UToday's Date: 09/26/2016     Maternal Data    Feeding    LATCH Score/Interventions                      Lactation Tools Discussed/Used     Consult Status      Michel BickersKendrick, Mae Denunzio McCoy 09/26/2016, 3:04 PM

## 2016-09-26 NOTE — Discharge Instructions (Signed)
Postpartum Care After Vaginal Delivery  The period of time right after you deliver your newborn is called the postpartum period.  What kind of medical care will I receive?  · You may continue to receive fluids and medicines through an IV tube inserted into one of your veins.  · If an incision was made near your vagina (episiotomy) or if you had some vaginal tearing during delivery, cold compresses may be placed on your episiotomy or your tear. This helps to reduce pain and swelling.  · You may be given a squirt bottle to use when you go to the bathroom. You may use this until you are comfortable wiping as usual. To use the squirt bottle, follow these steps:  ? Before you urinate, fill the squirt bottle with warm water. Do not use hot water.  ? After you urinate, while you are sitting on the toilet, use the squirt bottle to rinse the area around your urethra and vaginal opening. This rinses away any urine and blood.  ? You may do this instead of wiping. As you start healing, you may use the squirt bottle before wiping yourself. Make sure to wipe gently.  ? Fill the squirt bottle with clean water every time you use the bathroom.  · You will be given sanitary pads to wear.  How can I expect to feel?  · You may not feel the need to urinate for several hours after delivery.  · You will have some soreness and pain in your abdomen and vagina.  · If you are breastfeeding, you may have uterine contractions every time you breastfeed for up to several weeks postpartum. Uterine contractions help your uterus return to its normal size.  · It is normal to have vaginal bleeding (lochia) after delivery. The amount and appearance of lochia is often similar to a menstrual period in the first week after delivery. It will gradually decrease over the next few weeks to a dry, yellow-brown discharge. For most women, lochia stops completely by 6-8 weeks after delivery. Vaginal bleeding can vary from woman to woman.  · Within the first few  days after delivery, you may have breast engorgement. This is when your breasts feel heavy, full, and uncomfortable. Your breasts may also throb and feel hard, tightly stretched, warm, and tender. After this occurs, you may have milk leaking from your breasts. Your health care provider can help you relieve discomfort due to breast engorgement. Breast engorgement should go away within a few days.  · You may feel more sad or worried than normal due to hormonal changes after delivery. These feelings should not last more than a few days. If these feelings do not go away after several days, speak with your health care provider.  How should I care for myself?  · Tell your health care provider if you have pain or discomfort.  · Drink enough water to keep your urine clear or pale yellow.  · Wash your hands thoroughly with soap and water for at least 20 seconds after changing your sanitary pads, after using the toilet, and before holding or feeding your baby.  · If you are not breastfeeding, avoid touching your breasts a lot. Doing this can make your breasts produce more milk.  · If you become weak or lightheaded, or you feel like you might faint, ask for help before:  ? Getting out of bed.  ? Showering.  · Change your sanitary pads frequently. Watch for any changes in your flow, such as   a sudden increase in volume, a change in color, the passing of large blood clots. If you pass a blood clot from your vagina, save it to show to your health care provider. Do not flush blood clots down the toilet without having your health care provider look at them.  · Make sure that all your vaccinations are up to date. This can help protect you and your baby from getting certain diseases. You may need to have immunizations done before you leave the hospital.  · If desired, talk with your health care provider about methods of family planning or birth control (contraception).  How can I start bonding with my baby?  Spending as much time as  possible with your baby is very important. During this time, you and your baby can get to know each other and develop a bond. Having your baby stay with you in your room (rooming in) can give you time to get to know your baby. Rooming in can also help you become comfortable caring for your baby. Breastfeeding can also help you bond with your baby.  How can I plan for returning home with my baby?  · Make sure that you have a car seat installed in your vehicle.  ? Your car seat should be checked by a certified car seat installer to make sure that it is installed safely.  ? Make sure that your baby fits into the car seat safely.  · Ask your health care provider any questions you have about caring for yourself or your baby. Make sure that you are able to contact your health care provider with any questions after leaving the hospital.  This information is not intended to replace advice given to you by your health care provider. Make sure you discuss any questions you have with your health care provider.  Document Released: 08/05/2007 Document Revised: 03/12/2016 Document Reviewed: 09/12/2015  Elsevier Interactive Patient Education © 2017 [employer]    Breastfeeding  Deciding to breastfeed is one of the best choices you can make for you and your baby. A change in hormones during pregnancy causes your breast tissue to grow and increases the number and size of your milk ducts. These hormones also allow proteins, sugars, and fats from your blood supply to make breast milk in your milk-producing glands. Hormones prevent breast milk from being released before your baby is born as well as prompt milk flow after birth. Once breastfeeding has begun, thoughts of your baby, as well as his or her sucking or crying, can stimulate the release of milk from your milk-producing glands.  Benefits of breastfeeding  For Your Baby  · Your first milk (colostrum) helps your baby's digestive system function better.  · There are antibodies in  your milk that help your baby fight off infections.  · Your baby has a lower incidence of asthma, allergies, and sudden infant death syndrome.  · The nutrients in breast milk are better for your baby than infant formulas and are designed uniquely for your baby’s needs.  · Breast milk improves your baby's brain development.  · Your baby is less likely to develop other conditions, such as childhood obesity, asthma, or type 2 diabetes mellitus.    For You  · Breastfeeding helps to create a very special bond between you and your baby.  · Breastfeeding is convenient. Breast milk is always available at the correct temperature and costs nothing.  · Breastfeeding helps to burn calories and helps you lose the weight   gained during pregnancy.  · Breastfeeding makes your uterus contract to its prepregnancy size faster and slows bleeding (lochia) after you give birth.  · Breastfeeding helps to lower your risk of developing type 2 diabetes mellitus, osteoporosis, and breast or ovarian cancer later in life.    Signs that your baby is hungry  Early Signs of Hunger  · Increased alertness or activity.  · Stretching.  · Movement of the head from side to side.  · Movement of the head and opening of the mouth when the corner of the mouth or cheek is stroked (rooting).  · Increased sucking sounds, smacking lips, cooing, sighing, or squeaking.  · Hand-to-mouth movements.  · Increased sucking of fingers or hands.    Late Signs of Hunger  · Fussing.  · Intermittent crying.    Extreme Signs of Hunger   Signs of extreme hunger will require calming and consoling before your baby will be able to breastfeed successfully. Do not wait for the following signs of extreme hunger to occur before you initiate breastfeeding:  · Restlessness.  · A loud, strong cry.  · Screaming.    Breastfeeding basics   Breastfeeding Initiation  · Find a comfortable place to sit or lie down, with your neck and back well supported.  · Place a pillow or rolled up blanket  under your baby to bring him or her to the level of your breast (if you are seated). Nursing pillows are specially designed to help support your arms and your baby while you breastfeed.  · Make sure that your baby's abdomen is facing your abdomen.  · Gently massage your breast. With your fingertips, massage from your chest wall toward your nipple in a circular motion. This encourages milk flow. You may need to continue this action during the feeding if your milk flows slowly.  · Support your breast with 4 fingers underneath and your thumb above your nipple. Make sure your fingers are well away from your nipple and your baby’s mouth.  · Stroke your baby's lips gently with your finger or nipple.  · When your baby's mouth is open wide enough, quickly bring your baby to your breast, placing your entire nipple and as much of the colored area around your nipple (areola) as possible into your baby's mouth.  ? More areola should be visible above your baby's upper lip than below the lower lip.  ? Your baby's tongue should be between his or her lower gum and your breast.  · Ensure that your baby's mouth is correctly positioned around your nipple (latched). Your baby's lips should create a seal on your breast and be turned out (everted).  · It is common for your baby to suck about 2-3 minutes in order to start the flow of breast milk.    Latching   Teaching your baby how to latch on to your breast properly is very important. An improper latch can cause nipple pain and decreased milk supply for you and poor weight gain in your baby. Also, if your baby is not latched onto your nipple properly, he or she may swallow some air during feeding. This can make your baby fussy. Burping your baby when you switch breasts during the feeding can help to get rid of the air. However, teaching your baby to latch on properly is still the best way to prevent fussiness from swallowing air while breastfeeding.  Signs that your baby has  successfully latched on to your nipple:  · Silent   tugging or silent sucking, without causing you pain.  · Swallowing heard between every 3-4 sucks.  · Muscle movement above and in front of his or her ears while sucking.    Signs that your baby has not successfully latched on to nipple:  · Sucking sounds or smacking sounds from your baby while breastfeeding.  · Nipple pain.    If you think your baby has not latched on correctly, slip your finger into the corner of your baby’s mouth to break the suction and place it between your baby's gums. Attempt breastfeeding initiation again.  Signs of Successful Breastfeeding   Signs from your baby:  · A gradual decrease in the number of sucks or complete cessation of sucking.  · Falling asleep.  · Relaxation of his or her body.  · Retention of a small amount of milk in his or her mouth.  · Letting go of your breast by himself or herself.    Signs from you:  · Breasts that have increased in firmness, weight, and size 1-3 hours after feeding.  · Breasts that are softer immediately after breastfeeding.  · Increased milk volume, as well as a change in milk consistency and color by the fifth day of breastfeeding.  · Nipples that are not sore, cracked, or bleeding.    Signs That Your Baby is Getting Enough Milk  · Wetting at least 1-2 diapers during the first 24 hours after birth.  · Wetting at least 5-6 diapers every 24 hours for the first week after birth. The urine should be clear or pale yellow by 5 days after birth.  · Wetting 6-8 diapers every 24 hours as your baby continues to grow and develop.  · At least 3 stools in a 24-hour period by age 5 days. The stool should be soft and yellow.  · At least 3 stools in a 24-hour period by age 7 days. The stool should be seedy and yellow.  · No loss of weight greater than 10% of birth weight during the first 3 days of age.  · Average weight gain of 4-7 ounces (113-198 g) per week after age 4 days.  · Consistent daily weight gain by age 5  days, without weight loss after the age of 2 weeks.    After a feeding, your baby may spit up a small amount. This is common.  Breastfeeding frequency and duration  Frequent feeding will help you make more milk and can prevent sore nipples and breast engorgement. Breastfeed when you feel the need to reduce the fullness of your breasts or when your baby shows signs of hunger. This is called "breastfeeding on demand." Avoid introducing a pacifier to your baby while you are working to establish breastfeeding (the first 4-6 weeks after your baby is born). After this time you may choose to use a pacifier. Research has shown that pacifier use during the first year of a baby's life decreases the risk of sudden infant death syndrome (SIDS).  Allow your baby to feed on each breast as long as he or she wants. Breastfeed until your baby is finished feeding. When your baby unlatches or falls asleep while feeding from the first breast, offer the second breast. Because newborns are often sleepy in the first few weeks of life, you may need to awaken your baby to get him or her to feed.  Breastfeeding times will vary from baby to baby. However, the following rules can serve as a guide to help you ensure   that your baby is properly fed:  · Newborns (babies 4 weeks of age or younger) may breastfeed every 1-3 hours.  · Newborns should not go longer than 3 hours during the day or 5 hours during the night without breastfeeding.  · You should breastfeed your baby a minimum of 8 times in a 24-hour period until you begin to introduce solid foods to your baby at around 6 months of age.    Breast milk pumping  Pumping and storing breast milk allows you to ensure that your baby is exclusively fed your breast milk, even at times when you are unable to breastfeed. This is especially important if you are going back to work while you are still breastfeeding or when you are not able to be present during feedings. Your lactation consultant can give  you guidelines on how long it is safe to store breast milk.  A breast pump is a machine that allows you to pump milk from your breast into a sterile bottle. The pumped breast milk can then be stored in a refrigerator or freezer. Some breast pumps are operated by hand, while others use electricity. Ask your lactation consultant which type will work best for you. Breast pumps can be purchased, but some hospitals and breastfeeding support groups lease breast pumps on a monthly basis. A lactation consultant can teach you how to hand express breast milk, if you prefer not to use a pump.  Caring for your breasts while you breastfeed  Nipples can become dry, cracked, and sore while breastfeeding. The following recommendations can help keep your breasts moisturized and healthy:  · Avoid using soap on your nipples.  · Wear a supportive bra. Although not required, special nursing bras and tank tops are designed to allow access to your breasts for breastfeeding without taking off your entire bra or top. Avoid wearing underwire-style bras or extremely tight bras.  · Air dry your nipples for 3-4 minutes after each feeding.  · Use only cotton bra pads to absorb leaked breast milk. Leaking of breast milk between feedings is normal.  · Use lanolin on your nipples after breastfeeding. Lanolin helps to maintain your skin's normal moisture barrier. If you use pure lanolin, you do not need to wash it off before feeding your baby again. Pure lanolin is not toxic to your baby. You may also hand express a few drops of breast milk and gently massage that milk into your nipples and allow the milk to air dry.    In the first few weeks after giving birth, some women experience extremely full breasts (engorgement). Engorgement can make your breasts feel heavy, warm, and tender to the touch. Engorgement peaks within 3-5 days after you give birth. The following recommendations can help ease engorgement:  · Completely empty your breasts while  breastfeeding or pumping. You may want to start by applying warm, moist heat (in the shower or with warm water-soaked hand towels) just before feeding or pumping. This increases circulation and helps the milk flow. If your baby does not completely empty your breasts while breastfeeding, pump any extra milk after he or she is finished.  · Wear a snug bra (nursing or regular) or tank top for 1-2 days to signal your body to slightly decrease milk production.  · Apply ice packs to your breasts, unless this is too uncomfortable for you.  · Make sure that your baby is latched on and positioned properly while breastfeeding.    If engorgement persists after 48 hours   of following these recommendations, contact your health care provider or a lactation consultant.  Overall health care recommendations while breastfeeding  · Eat healthy foods. Alternate between meals and snacks, eating 3 of each per day. Because what you eat affects your breast milk, some of the foods may make your baby more irritable than usual. Avoid eating these foods if you are sure that they are negatively affecting your baby.  · Drink milk, fruit juice, and water to satisfy your thirst (about 10 glasses a day).  · Rest often, relax, and continue to take your prenatal vitamins to prevent fatigue, stress, and anemia.  · Continue breast self-awareness checks.  · Avoid chewing and smoking tobacco. Chemicals from cigarettes that pass into breast milk and exposure to secondhand smoke may harm your baby.  · Avoid alcohol and drug use, including marijuana.  Some medicines that may be harmful to your baby can pass through breast milk. It is important to ask your health care provider before taking any medicine, including all over-the-counter and prescription medicine as well as vitamin and herbal supplements.  It is possible to become pregnant while breastfeeding. If birth control is desired, ask your health care provider about options that will be safe for your  baby.  Contact a health care provider if:  · You feel like you want to stop breastfeeding or have become frustrated with breastfeeding.  · You have painful breasts or nipples.  · Your nipples are cracked or bleeding.  · Your breasts are red, tender, or warm.  · You have a swollen area on either breast.  · You have a fever or chills.  · You have nausea or vomiting.  · You have drainage other than breast milk from your nipples.  · Your breasts do not become full before feedings by the fifth day after you give birth.  · You feel sad and depressed.  · Your baby is too sleepy to eat well.  · Your baby is having trouble sleeping.  · Your baby is wetting less than 3 diapers in a 24-hour period.  · Your baby has less than 3 stools in a 24-hour period.  · Your baby's skin or the white part of his or her eyes becomes yellow.  · Your baby is not gaining weight by 5 days of age.  Get help right away if:  · Your baby is overly tired (lethargic) and does not want to wake up and feed.  · Your baby develops an unexplained fever.  This information is not intended to replace advice given to you by your health care provider. Make sure you discuss any questions you have with your health care provider.  Document Released: 10/08/2005 Document Revised: 03/21/2016 Document Reviewed: 04/01/2013  Elsevier Interactive Patient Education © 2017 [employer]

## 2016-09-26 NOTE — Progress Notes (Signed)
Resident notified regarding canceling DC order.  Baby will not be going home and mom is needing prn pain meds

## 2016-09-26 NOTE — Anesthesia Postprocedure Evaluation (Signed)
Anesthesia Post Note  Patient: Sydney Alexander  Procedure(s) Performed: * No procedures listed *  Patient location during evaluation: Mother Baby Anesthesia Type: Epidural Level of consciousness: awake and alert Pain management: satisfactory to patient Vital Signs Assessment: post-procedure vital signs reviewed and stable Respiratory status: spontaneous breathing Cardiovascular status: blood pressure returned to baseline Postop Assessment: no headache, no backache, epidural receding, no signs of nausea or vomiting, patient able to bend at knees and adequate PO intake Anesthesia complication: Low Platelet count. Epidural will be left in place until  platelets are satisfactory.     Last Vitals:  Vitals:   09/25/16 2230 09/26/16 0238  BP: 103/62 (!) 95/52  Pulse: 94 85  Resp: 18 16  Temp: 36.8 C 36.6 C    Last Pain:  Vitals:   09/26/16 1545  TempSrc:   PainSc: 5    Pain Goal: Patients Stated Pain Goal: 0 (09/25/16 1945)               Sevastian Witczak

## 2016-09-26 NOTE — Lactation Note (Signed)
This note was copied from a baby's chart. Lactation Consultation Note  Patient Name: Boy Kendrick FriesJaime Gambrill WUJWJ'XToday's Date: 09/26/2016 Reason for consult: Follow-up assessment Baby at 21 hr of life. Baby was coming off the breast upon entry. Mom denies breast or nipple pain. She reports the last 2 feedings have gone "very well". She was trying to use her personal DEBP while baby was latched but "I was having a hard time doing both at the same time". She plans to wait to pump again "until I need to". Discussed baby behavior, feeding frequency, baby belly size, voids, wt loss, breast changes, and nipple care. She is aware of lactation services and support group. She will call as needed.    Maternal Data    Feeding Feeding Type: Breast Fed Length of feed: 30 min  LATCH Score/Interventions Latch: Grasps breast easily, tongue down, lips flanged, rhythmical sucking. (per mom)  Audible Swallowing: A few with stimulation (per mom) Intervention(s): Skin to skin;Alternate breast massage  Type of Nipple: Everted at rest and after stimulation  Comfort (Breast/Nipple): Soft / non-tender     Hold (Positioning): No assistance needed to correctly position infant at breast. (per mom)  LATCH Score: 9  Lactation Tools Discussed/Used     Consult Status Consult Status: Follow-up Date: 09/26/16 Follow-up type: In-patient    Rulon Eisenmengerlizabeth E Inez Rosato 09/26/2016, 4:13 PM

## 2016-09-26 NOTE — Discharge Summary (Signed)
OB Discharge Summary     Patient Name: Sydney Alexander DOB: 02/05/1986 MRN: [id number]  Date of admission: 09/25/2016 Delivering MD: Deforest HoylesWALLACE, NOAH I   Date of discharge: 09/26/2016  Admitting diagnosis: 29WKS,WATER BROKE, CTX Intrauterine pregnancy: 6417w3d     Secondary diagnosis:  Active Problems:   Normal labor  Additional problems: none     Discharge diagnosis: Term Pregnancy Delivered                                                                                                Post partum procedures:none  Augmentation: none  Complications: None  Hospital course:  Onset of Labor With Vaginal Delivery     30 y.o. yo G3P3003 at 2717w3d was admitted in Active Labor on 09/25/2016. Patient had an uncomplicated labor course as follows:  Membrane Rupture Time/Date: 10:00 AM ,09/25/2016   Intrapartum Procedures: Episiotomy: None [1]                                         Lacerations:  1st degree [2];Perineal [11]  Patient had a delivery of a Viable infant. 09/25/2016  Information for the patient's newborn:  Caswell CorwinMartin, Boy Gracious [[id number]][[id number]]  Delivery Method: Vag-Spont    Pateint had an uncomplicated postpartum course.  She is ambulating, tolerating a regular diet, passing flatus, and urinating well. Desires early d/c if possible. Patient is discharged home in stable condition on 09/26/16.    Physical exam Vitals:   09/25/16 2045 09/25/16 2130 09/25/16 2230 09/26/16 0238  BP: 102/61 123/63 103/62 (!) 95/52  Pulse: (!) 126 87 94 85  Resp:  18 18 16   Temp:  98.6 F (37 C) 98.3 F (36.8 C) 97.8 F (36.6 C)  TempSrc:  Oral Oral Oral  SpO2:  100% 100% 99%  Weight:      Height:       General: alert, cooperative and no distress Lochia: appropriate Uterine Fundus: firm Incision: N/A DVT Evaluation: No evidence of DVT seen on physical exam. Negative Homan's sign. No cords or calf tenderness. No significant calf/ankle edema. Labs: Lab Results  Component Value Date   WBC 6.0  09/25/2016   HGB 12.0 09/25/2016   HCT 35.6 (L) 09/25/2016   MCV 81.7 09/25/2016   PLT 154 09/25/2016   No flowsheet data found.  Discharge instruction: per After Visit Summary and "Baby and Me Booklet".  After visit meds:    Medication List    STOP taking these medications   CALCIUM PO     TAKE these medications   FLINTSTONES GUMMIES PLUS PO Take 2 each by mouth daily.   ibuprofen 600 MG tablet Commonly known as:  ADVIL,MOTRIN Take 1 tablet (600 mg total) by mouth every 6 (six) hours as needed for mild pain, moderate pain or cramping.       Diet: routine diet  Activity: Advance as tolerated. Pelvic rest for 6 weeks.   Outpatient follow up:4-6wks for pp visit/pre-op w/ MD for interval salpingectomy. Will also need repeat  colpo 6-8wks pp  Follow up Appt:Future Appointments Date Time Provider Department Center  09/27/2016 10:15 AM Jacklyn ShellFrances Cresenzo-Dishmon, CNM [facility]   Follow up Visit:No Follow-up on file.  Postpartum contraception: abstinence until interval salpingectomy  Newborn Data: Live born female  Birth Weight: 7 lb 4.2 oz (3295 g) APGAR: 9, 9  Baby Feeding: Breast Disposition:home with mother pending d/c from peds   09/26/2016 Marge DuncansBooker, Kimberly Randall, CNM

## 2016-09-26 NOTE — Plan of Care (Signed)
Problem: Activity: Goal: Will verbalize the importance of balancing activity with adequate rest periods Outcome: Progressing Patient understands importance of ambulation during evening shift with assessments and resting while baby sleeps.  Goal: Ability to tolerate increased activity will improve Outcome: Progressing Pt understands she will be encouraged to increase activity as her balance improves and strength increases.  Problem: Education: Goal: Knowledge of condition will improve Outcome: Progressing Admission information gone over with patient along with mother/baby booklet and handouts. Mother verbalizes understanding.   Problem: Coping: Goal: Ability to cope will improve Outcome: Progressing Pt states she is in good spirits at this time. Previous baby pt admits to having baby blues that she did not go in to get treatment for. Verbalizes understanding of letting nursing know if these sorts of feelings come up again.   Problem: Life Cycle: Goal: Risk for postpartum hemorrhage will decrease Outcome: Progressing VSS. Fundus firm @ u. Scant - small amount of rubra lochia with no clots. No pain at this time.  Goal: Chance of risk for complications during the postpartum period will decrease Outcome: Progressing VSS. Afebrile. Pt able to void. Will continue to monitor.   Problem: Nutritional: Goal: Dietary intake will improve Outcome: Progressing Pt able to tolerate regular diet.  Goal: Mothers verbalization of comfort with breastfeeding process will improve Outcome: Progressing Pt is able to do teach back with breastfeeding assistance and education. Pt breast fed previous two children with success. Baby is able to be positioned well by mother and latched on breast without difficulty.   Problem: Pain Management: Goal: General experience of comfort will improve and pain level will decrease Outcome: Progressing Pt has no pain at this time. Given scheduled ibuprofen per MAR. Informed  patient of PRN medication.   Problem: Bowel/Gastric: Goal: Gastrointestinal status will improve Outcome: Progressing Pt had bowel movement on 12/4. States she is passing flatus.   Problem: Respiratory: Goal: Ability to maintain adequate ventilation will improve Outcome: Completed/Met Date Met: 09/26/16 Pt VSS. BBS clear; denies cough; denies SOB.   Problem: Skin Integrity: Goal: Demonstration of wound healing without infection will improve Outcome: Progressing Pt had 1st degree tear - uses peri bottle to wash perineal area, denies needing dermoplast. Changes peri pads PRN .  Problem: Urinary Elimination: Goal: Ability to reestablish a normal urinary elimination pattern will improve Outcome: Completed/Met Date Met: 09/26/16 Pt able to void post delivery without complication or difficulty.

## 2016-09-27 ENCOUNTER — Encounter: Payer: Medicaid Other | Admitting: Advanced Practice Midwife

## 2016-09-27 NOTE — Lactation Note (Signed)
This note was copied from a baby's chart. Lactation Consultation Note; Mother states that infant fed well during the night. She denies having any concerns about breastfeeding. Mother advised to continue to breastfeed at least 8-12 times in 24 hours. Encouraged mother to continue with STS frequently. Mother is aware of available lactation services.   Patient Name: Sydney Alexander ZOXWR'UToday's Date: 09/27/2016     Maternal Data    Feeding    LATCH Score/Interventions                      Lactation Tools Discussed/Used     Consult Status      Michel BickersKendrick, Shandie Bertz McCoy 09/27/2016, 10:33 AM

## 2016-09-27 NOTE — Plan of Care (Signed)
Problem: Safety: Goal: Ability to remain free from injury will improve Outcome: Completed/Met Date Met: 09/27/16 Pt uses call light appropriately - is steady with ambulation. VSS. Denies dizziness. Personal items within reach. Hourly rounds completed.   Problem: Pain Managment: Goal: General experience of comfort will improve Outcome: Progressing Pt pain tolerable with schedule motrin and prn tylenol. oxi x1 this afternoon for cramping pain. At this time denies wanting further pain medication intervention.  Problem: Physical Regulation: Goal: Ability to maintain clinical measurements within normal limits will improve Outcome: Progressing VSS. Bleeding small amount with no clots - fundus firm u/1.  Goal: Will remain free from infection Outcome: Completed/Met Date Met: 09/27/16 Labs monitored, afebrile.   Problem: Skin Integrity: Goal: Risk for impaired skin integrity will decrease Outcome: Completed/Met Date Met: 09/27/16 Pt changes peri pads as needed - no other open areas to skin. Pt is able to move self in bed- maintaining diet.   Problem: Tissue Perfusion: Goal: Risk factors for ineffective tissue perfusion will decrease Outcome: Completed/Met Date Met: 09/27/16 Pt denies calf pain, ambulates regularly.   Problem: Fluid Volume: Goal: Ability to maintain a balanced intake and output will improve Outcome: Completed/Met Date Met: 09/27/16 Pt VSS, maintaining regular diet, tolerating oral hydration, voiding regularly.   Problem: Nutrition: Goal: Adequate nutrition will be maintained Outcome: Completed/Met Date Met: 09/27/16 Pt tolerates regular diet; denies nausea/vomiting.

## 2016-09-27 NOTE — Anesthesia Postprocedure Evaluation (Signed)
Anesthesia Post Note  Patient: Sydney Alexander  Procedure(s) Performed: * No procedures listed *  Patient location during evaluation: Mother Baby Anesthesia Type: Epidural Level of consciousness: awake and alert Pain management: pain level controlled Vital Signs Assessment: post-procedure vital signs reviewed and stable Respiratory status: spontaneous breathing, nonlabored ventilation and respiratory function stable Cardiovascular status: stable Postop Assessment: no headache, no backache and epidural receding Anesthetic complications: no    Last Vitals:  Vitals:   09/26/16 1928 09/27/16 0515  BP: 123/69 (!) 102/50  Pulse: (!) 104 83  Resp: 18 18  Temp: 37.1 C 36.9 C    Last Pain:  Vitals:   09/27/16 0515  TempSrc: Oral  PainSc:                  Lewie LoronJohn Can Lucci

## 2016-09-27 NOTE — Discharge Summary (Signed)
OB Discharge Summary     Patient Name: Sydney Alexander DOB: 05/15/1986 MRN: [id number]  Date of admission: 09/25/2016 Delivering MD: Deforest HoylesWALLACE, NOAH I   Date of discharge: 09/27/2016  Admitting diagnosis: 38WKS,WATER BROKE, CTX Intrauterine pregnancy: 4541w3d     Secondary diagnosis:  Active Problems:   Normal labor  Additional problems: none     Discharge diagnosis: Term Pregnancy Delivered                                                                                                Post partum procedures:none  Augmentation: none  Complications: None  Hospital course:  Onset of Labor With Vaginal Delivery     30 y.o. yo G3P3003 at 3941w3d was admitted in Active Labor on 09/25/2016. Patient had an uncomplicated labor course as follows:  Membrane Rupture Time/Date: 10:00 AM ,09/25/2016   Intrapartum Procedures: Episiotomy: None [1]                                         Lacerations:  1st degree [2];Perineal [11]  Patient had a delivery of a Viable infant. 09/25/2016  Information for the patient's newborn:  Caswell CorwinMartin, Boy Kalia [[id number]][[id number]]  Delivery Method: Vag-Spont    Pateint had an uncomplicated postpartum course.  She is ambulating, tolerating a regular diet, passing flatus, and urinating well. Patient is discharged home in stable condition on 09/27/16.    Physical exam Vitals:   09/26/16 0238 09/26/16 1827 09/26/16 1928 09/27/16 0515  BP: (!) 95/52 111/67 123/69 (!) 102/50  Pulse: 85 93 (!) 104 83  Resp: 16 18 18 18   Temp: 97.8 F (36.6 C) 98.3 F (36.8 C) 98.8 F (37.1 C) 98.4 F (36.9 C)  TempSrc: Oral Oral Oral Oral  SpO2: 99%  100% 99%  Weight:      Height:       General: alert, cooperative and no distress Lochia: appropriate Uterine Fundus: firm Incision: N/A DVT Evaluation: No evidence of DVT seen on physical exam. Labs: Lab Results  Component Value Date   WBC 6.0 09/25/2016   HGB 12.0 09/25/2016   HCT 35.6 (L) 09/25/2016   MCV 81.7 09/25/2016   PLT  154 09/25/2016   No flowsheet data found.  Discharge instruction: per After Visit Summary and "Baby and Me Booklet".  After visit meds:    Medication List    STOP taking these medications   CALCIUM PO     TAKE these medications   FLINTSTONES GUMMIES PLUS PO Take 2 each by mouth daily.   ibuprofen 600 MG tablet Commonly known as:  ADVIL,MOTRIN Take 1 tablet (600 mg total) by mouth every 6 (six) hours as needed for mild pain, moderate pain or cramping.       Diet: routine diet  Activity: Advance as tolerated. Pelvic rest for 6 weeks.   Outpatient follow up:4-6wks for pp visit/pre-op w/ MD for interval salpingectomy. Will also need repeat colpo 6-8wks pp Follow up Appt:Future Appointments Date Time Provider Department Center  09/27/2016 10:15 AM Jacklyn ShellFrances Cresenzo-Dishmon, CNM [facility]   Follow up Visit:No Follow-up on file.  Postpartum contraception: abstinence until interval salpingectomy  Newborn Data: Live born female  Birth Weight: 7 lb 4.2 oz (3295 g) APGAR: 9, 9  Baby Feeding: Breast Disposition:home with mother   09/27/2016 Clearance CootsAndrew Tyson, MD  OB FELLOW DISCHARGE ATTESTATION  I have seen and examined this patient and agree with above documentation in the resident's note.   Ernestina PennaNicholas Lewanda Perea, MD 8:53 AM

## 2016-10-26 ENCOUNTER — Encounter: Payer: Self-pay | Admitting: Women's Health

## 2016-10-26 ENCOUNTER — Ambulatory Visit (INDEPENDENT_AMBULATORY_CARE_PROVIDER_SITE_OTHER): Payer: Medicaid Other | Admitting: Women's Health

## 2016-10-26 DIAGNOSIS — F53 Postpartum depression: Secondary | ICD-10-CM | POA: Insufficient documentation

## 2016-10-26 DIAGNOSIS — O99345 Other mental disorders complicating the puerperium: Secondary | ICD-10-CM

## 2016-10-26 NOTE — Progress Notes (Addendum)
Subjective:    Sydney Alexander is a 31 y.o. 13P3003 African American female who presents for a postpartum visit. She is 4 weeks postpartum following a spontaneous vaginal delivery at 38.3 gestational weeks. Anesthesia: epidural. I have fully reviewed the prenatal and intrapartum course. Postpartum course has been complicated by PPD. Baby's course has been uncomplicated. Baby is feeding by breast, feels like she doesn't have enough supply, is exclusively bf, but doesn't get much when pumping- baby is gaining weight and peds not concerned.  Bleeding no bleeding. Bowel function is some constipation. Bladder function is normal. Patient is not sexually active. Last sexual activity: prior to birth of baby. Contraception method is abstinence and wants salpingectomy. Postpartum depression screening: positive. Score 24.  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 Decreased appetite, not sleeping well. Does still find joy in things she used to. Burgess EstelleYesterday was her bday and she got dressed up and put on makeup which made her feel better. Some occ thoughts that she 'just doesn't want to be here', but no plans. Discussed medications +/- therapy. Only wants to do therapy at this tim. Husband is present today, and is supportive. Last pap 03/05/16 and was ASCUS w/ +HRHPV, colpo visible lesion, repeat colpo 6-8wk pp.  The following portions of the patient's history were reviewed and updated as appropriate: allergies, current medications, past medical history, past surgical history and problem list.  Review of Systems Pertinent items are noted in HPI.   Vitals:   10/26/16 1112  BP: 105/68  Pulse: 67  Weight: 173 lb 9.6 oz (78.7 kg)   No LMP recorded.  Objective:   General:  alert, cooperative and no distress   Breasts:  deferred, no complaints  Lungs: clear to auscultation bilaterally  Heart:  regular rate and rhythm  Abdomen: soft, nontender   Vulva: normal  Vagina: normal vagina, small knot  from repair still present and tender to pt- removed w/o difficulty  Cervix:  closed  Corpus: Well-involuted  Adnexa:  Non-palpable  Rectal Exam: No hemorrhoids        Assessment:   Postpartum exam 4 wks s/p SVB Breastfeeding Constipation Perceived decreased milk supply Depression screening PPD w/ occ suicidal ideations, no plans Abnormal pap, needs repeat colpo pp Contraception counseling   Plan:  Contraception: abstinence until salpingectomy  Gave printed info on milk supply tips, constipation prevention/relief [facility], pt to call if hasn't heard from them w/in 1wk- has friend who works there she can contact if needed; declines meds today Call us/go straight to ED for worsening suicidal ideations/or development of plan Follow up in: 1 week for pre-op w/ MD or earlier if needed, then 4wks for PPD f/u Will need colpo 6-8wk pp/?can do w/ salpingectomy?  Marge DuncansBooker, Kimberly Randall CNM, [facility]NP-BC 10/26/2016 11:17 AM

## 2016-10-26 NOTE — Patient Instructions (Addendum)
Tips To Increase Milk Supply  Lots of water! Enough so that your urine is clear  Plenty of calories, if you're not getting enough calories, your milk supply can decrease  Breastfeed/pump often, every 2-3 hours x 20-5730mins  Fenugreek 3 pills 3 times a day, this may make your urine smell like maple syrup  Mother's Milk Tea  Lactation cookies, google for the recipe  Real oatmeal  Constipation  Drink plenty of fluid, preferably water, throughout the day  Eat foods high in fiber such as fruits, vegetables, and grains  Exercise, such as walking, is a good way to keep your bowels regular  Drink warm fluids, especially warm prune juice, or decaf coffee  Eat a 1/2 cup of real oatmeal (not instant), 1/2 cup applesauce, and 1/2-1 cup warm prune juice every day  If needed, you may take Colace (docusate sodium) stool softener once or twice a day to help keep the stool soft. If you are pregnant, wait until you are out of your first trimester (12-14 weeks of pregnancy)  If you still are having problems with constipation, you may take Miralax once daily as needed to help keep your bowels regular.  If you are pregnant, wait until you are out of your first trimester (12-14 weeks of pregnancy)    Salpingectomy Salpingectomy, also called tubectomy, is the surgical removal of one of the fallopian tubes. The fallopian tubes are where eggs travel from the ovaries to the uterus. Removing one fallopian tube does not prevent you from becoming pregnant. It also does not cause problems with your menstrual periods. You may need a salpingectomy if you:  Have a fertilized egg that attaches to the fallopian tube (ectopic pregnancy), especially one that causes the tube to burst or tear (rupture).  Have an infected fallopian tube.  Have cancer of the fallopian tube or nearby organs.  Have had an ovary removed due to a cyst or tumor.  Have had your uterus removed. There are three different methods that can  be used for a salpingectomy:  Open. This method involves making one large incision in your abdomen.  Laparoscopic. This method involves using a thin, lighted tube with a tiny camera on the end (laparoscope) to help perform the procedure. The laparoscope will allow your surgeon to make several small incisions in the abdomen instead of a large incision.  Robot-assisted: This method involves using a computer to control surgical instruments that are attached to robotic arms. Tell a health care provider about:  Any allergies you have.  All medicines you are taking, including vitamins, herbs, eye drops, creams, and over-the-counter medicines.  Any problems you or family members have had with anesthetic medicines.  Any blood disorders you have.  Any surgeries you have had.  Any medical conditions you have.  Whether you are pregnant or may be pregnant. What are the risks? Generally, this is a safe procedure. However, problems may occur, including:  Infection.  Bleeding.  Allergic reactions to medicines.  Damage to other structures or organs.  Blood clots in the legs or lungs. What happens before the procedure? Staying hydrated  Follow instructions from your health care provider about hydration, which may include:  Up to 2 hours before the procedure - you may continue to drink clear liquids, such as water, clear fruit juice, black coffee, and plain tea. Eating and drinking restrictions  Follow instructions from your health care provider about eating and drinking, which may include:  8 hours before the procedure - stop  eating heavy meals or foods such as meat, fried foods, or fatty foods.  6 hours before the procedure - stop eating light meals or foods, such as toast or cereal.  6 hours before the procedure - stop drinking milk or drinks that contain milk.  2 hours before the procedure - stop drinking clear liquids. Medicines  Ask your health care provider about:  Changing  or stopping your regular medicines. This is especially important if you are taking diabetes medicines or blood thinners.  Taking medicines such as aspirin and ibuprofen. These medicines can thin your blood. Do not take these medicines before your procedure if your health care provider instructs you not to.  You may be given antibiotic medicine to help prevent infection. General instructions  Do not smoke for at least 2 weeks before your procedure. If you need help quitting, ask your health care provider.  You may have an exam or tests, such as an electrocardiogram (ECG).  You may have a blood or urine sample taken.  Ask your health care provider:  Whether you should stop removing hair from your surgical area.  How your surgical site will be marked or identified.  You may be asked to shower with a germ-killing soap.  Plan to have someone take you home from the hospital or clinic.  If you will be going home right after the procedure, plan to have someone with you for 24 hours. What happens during the procedure?  To reduce your risk of infection:  Your health care team will wash or sanitize their hands.  Hair may be removed from the surgical area.  Your skin will be washed with soap.  An IV tube will be inserted into one of your veins.  You will be given a medicine to make you fall asleep (general anesthetic). You may also be given a medicine to help you relax (sedative).  A thin tube (catheter) may be inserted through your urethra and into your bladder to drain urine during your procedure.  Depending on the type of procedure you are having, one incision or several small incisions will be made in your abdomen.  Your fallopian tube will be cut and removed from where it attaches to your uterus.  Your blood vessels will be clamped and tied to prevent excess bleeding.  The incision(s) in your abdomen will be closed with stitches (sutures), staples, or skin glue.  A bandage  (dressing) may be placed over your incision(s). The procedure may vary among health care providers and hospitals. What happens after the procedure?  Your blood pressure, heart rate, breathing rate, and blood oxygen level will be monitored until the medicines you were given have worn off.  You may continue to receive fluids and medicines through an IV tube.  You may continue to have a catheter draining your urine.  You may have to wear compression stockings. These stockings help to prevent blood clots and reduce swelling in your legs.  You will be given pain medicine as needed.  Do not drive for 24 hours if you received a sedative. Summary  Salpingectomy is a surgical procedure to remove one of the fallopian tubes.  The procedure may be done with an open incision, with a laparoscope, or with computer-controlled instruments.  Depending on the type of procedure you are having, one incision or several small incisions will be made in your abdomen.  Your blood pressure, heart rate, breathing rate, and blood oxygen level will be monitored until the medicines you  were given have worn off.  Plan to have someone take you home from the hospital or clinic. This information is not intended to replace advice given to you by your health care provider. Make sure you discuss any questions you have with your health care provider. Document Released: 02/24/2009 Document Revised: 05/25/2016 Document Reviewed: 04/01/2013 Elsevier Interactive Patient Education  2017 [employer]  Postpartum Depression and Baby Blues The postpartum period begins right after the birth of a baby. During this time, there is often a great amount of joy and excitement. It is also a time of many changes in the life of the parents. Regardless of how many times a mother gives birth, each child brings new challenges and dynamics to the family. It is not unusual to have feelings of excitement along with confusing shifts in moods,  emotions, and thoughts. All mothers are at risk of developing postpartum depression or the "baby blues." These mood changes can occur right after giving birth, or they may occur many months after giving birth. The baby blues or postpartum depression can be mild or severe. Additionally, postpartum depression can go away rather quickly, or it can be a long-term condition. What are the causes? Raised hormone levels and the rapid drop in those levels are thought to be a main cause of postpartum depression and the baby blues. A number of hormones change during and after pregnancy. Estrogen and progesterone usually decrease right after the delivery of your baby. The levels of thyroid hormone and various cortisol steroids also rapidly drop. Other factors that play a role in these mood changes include major life events and genetics. What increases the risk? If you have any of the following risks for the baby blues or postpartum depression, know what symptoms to watch out for during the postpartum period. Risk factors that may increase the likelihood of getting the baby blues or postpartum depression include:  Having a personal or family history of depression.  Having depression while being pregnant.  Having premenstrual mood issues or mood issues related to oral contraceptives.  Having a lot of life stress.  Having marital conflict.  Lacking a social support network.  Having a baby with special needs.  Having health problems, such as diabetes. What are the signs or symptoms? Symptoms of baby blues include:  Brief changes in mood, such as going from extreme happiness to sadness.  Decreased concentration.  Difficulty sleeping.  Crying spells, tearfulness.  Irritability.  Anxiety. Symptoms of postpartum depression typically begin within the first month after giving birth. These symptoms include:  Difficulty sleeping or excessive sleepiness.  Marked weight loss.  Agitation.  Feelings  of worthlessness.  Lack of interest in activity or food. Postpartum psychosis is a very serious condition and can be dangerous. Fortunately, it is rare. Displaying any of the following symptoms is cause for immediate medical attention. Symptoms of postpartum psychosis include:  Hallucinations and delusions.  Bizarre or disorganized behavior.  Confusion or disorientation. How is this diagnosed? A diagnosis is made by an evaluation of your symptoms. There are no medical or lab tests that lead to a diagnosis, but there are various questionnaires that a health care provider may use to identify those with the baby blues, postpartum depression, or psychosis. Often, a screening tool called the [place] Postnatal Depression Scale is used to diagnose depression in the postpartum period. How is this treated? The baby blues usually goes away on its own in 1-2 weeks. Social support is often all that is needed.  You will be encouraged to get adequate sleep and rest. Occasionally, you may be given medicines to help you sleep. Postpartum depression requires treatment because it can last several months or longer if it is not treated. Treatment may include individual or group therapy, medicine, or both to address any social, physiological, and psychological factors that may play a role in the depression. Regular exercise, a healthy diet, rest, and social support may also be strongly recommended. Postpartum psychosis is more serious and needs treatment right away. Hospitalization is often needed. Follow these instructions at home:  Get as much rest as you can. Nap when the baby sleeps.  Exercise regularly. Some women find yoga and walking to be beneficial.  Eat a balanced and nourishing diet.  Do little things that you enjoy. Have a cup of tea, take a bubble bath, read your favorite magazine, or listen to your favorite music.  Avoid alcohol.  Ask for help with household chores, cooking, grocery shopping,  or running errands as needed. Do not try to do everything.  Talk to people close to you about how you are feeling. Get support from your partner, family members, friends, or other new moms.  Try to stay positive in how you think. Think about the things you are grateful for.  Do not spend a lot of time alone.  Only take over-the-counter or prescription medicine as directed by your health care provider.  Keep all your postpartum appointments.  Let your health care provider know if you have any concerns. Contact a health care provider if: You are having a reaction to or problems with your medicine. Get help right away if:  You have suicidal feelings.  You think you may harm the baby or someone else. This information is not intended to replace advice given to you by your health care provider. Make sure you discuss any questions you have with your health care provider. Document Released: 07/12/2004 Document Revised: 03/15/2016 Document Reviewed: 07/20/2013 Elsevier Interactive Patient Education  2017 [employer].

## 2016-11-06 ENCOUNTER — Encounter: Payer: Medicaid Other | Admitting: Obstetrics & Gynecology

## 2016-11-23 ENCOUNTER — Ambulatory Visit: Payer: Medicaid Other | Admitting: Women's Health

## 2016-11-29 ENCOUNTER — Encounter: Payer: Medicaid Other | Admitting: Obstetrics & Gynecology

## 2016-12-14 ENCOUNTER — Ambulatory Visit (INDEPENDENT_AMBULATORY_CARE_PROVIDER_SITE_OTHER): Payer: Medicaid Other | Admitting: Obstetrics & Gynecology

## 2016-12-14 ENCOUNTER — Encounter: Payer: Self-pay | Admitting: Obstetrics & Gynecology

## 2016-12-14 VITALS — BP 110/80 | HR 80 | Ht 61.0 in | Wt 178.0 lb

## 2016-12-14 DIAGNOSIS — Z01818 Encounter for other preprocedural examination: Secondary | ICD-10-CM

## 2016-12-14 DIAGNOSIS — Z3009 Encounter for other general counseling and advice on contraception: Secondary | ICD-10-CM

## 2016-12-14 NOTE — Progress Notes (Signed)
Preoperative History and Physical  Sydney Alexander is a 31 y.o. [phone] with Patient's last menstrual period was 12/07/2016. admitted for a laparoscopic sterilization procedure, specifically electrocautery. Patient was counseled prior to seeing me regarding contraception, including IUD, nexplanon, OCP, depo provera, etc.  She still wanted a permanent sterilization procedure.  She was then counseled by me regarding different techniques for sterilization.  She chose to have a bilateral salpingectomy and in fact was brought back in for a second counseling session when it was discovered at this point that medicaid will not cover salpingectomy as a sterilization procedure.  The patient was not happy with this because she wanted it done for ovarian cancer prophylaxis.  We discussed non operative contraception again and she still wants an operative sterilization and opts for a laparoscopic bilateral tubal ligation using electrocautery.    PMH:    Past Medical History:  Diagnosis Date  . Abnormal pap   . BV (bacterial vaginosis)   . Contraceptive management 03/10/2015  . Cramping affecting pregnancy, antepartum 03/26/2016  . Encounter for IUD removal 03/10/2015  . Vaginal discharge 03/26/2016  . Vaginal irritation 03/26/2016  . Vaginal Pap smear, abnormal     PSH:     Past Surgical History:  Procedure Laterality Date  . NO PAST SURGERIES      POb/GynH:      OB History    Gravida Para Term Preterm AB Living   3 3 3     3    SAB TAB Ectopic Multiple Live Births         0 3      SH:   Social History  Substance Use Topics  . Smoking status: Never Smoker  . Smokeless tobacco: Never Used  . Alcohol use No    FH:    Family History  Problem Relation Age of Onset  . Cancer Maternal Grandmother   . Thyroid disease Mother   . Cancer Paternal Aunt   . Diabetes Other     lots of aunts and uncles on both sides     Allergies: No Known Allergies  Medications:       Current Outpatient  Prescriptions:  Marland Kitchen  Multiple Vitamin (MULTIVITAMIN) tablet, Take 1 tablet by mouth daily., Disp: , Rfl:   Review of Systems:   Review of Systems  Constitutional: Negative for fever, chills, weight loss, malaise/fatigue and diaphoresis.  HENT: Negative for hearing loss, ear pain, nosebleeds, congestion, sore throat, neck pain, tinnitus and ear discharge.   Eyes: Negative for blurred vision, double vision, photophobia, pain, discharge and redness.  Respiratory: Negative for cough, hemoptysis, sputum production, shortness of breath, wheezing and stridor.   Cardiovascular: Negative for chest pain, palpitations, orthopnea, claudication, leg swelling and PND.  Gastrointestinal: Positive for abdominal pain. Negative for heartburn, nausea, vomiting, diarrhea, constipation, blood in stool and melena.  Genitourinary: Negative for dysuria, urgency, frequency, hematuria and flank pain.  Musculoskeletal: Negative for myalgias, back pain, joint pain and falls.  Skin: Negative for itching and rash.  Neurological: Negative for dizziness, tingling, tremors, sensory change, speech change, focal weakness, seizures, loss of consciousness, weakness and headaches.  Endo/Heme/Allergies: Negative for environmental allergies and polydipsia. Does not bruise/bleed easily.  Psychiatric/Behavioral: Negative for depression, suicidal ideas, hallucinations, memory loss and substance abuse. The patient is not nervous/anxious and does not have insomnia.      PHYSICAL EXAM:  Blood pressure 110/80, pulse 80, height 5\' 1"  (1.549 m), weight 178 lb (80.7 kg), last menstrual period 12/07/2016, currently breastfeeding.  Vitals reviewed. Constitutional: She is oriented to person, place, and time. She appears well-developed and well-nourished.  HENT:  Head: Normocephalic and atraumatic.  Right Ear: External ear normal.  Left Ear: External ear normal.  Nose: Nose normal.  Mouth/Throat: Oropharynx is clear and moist.  Eyes:  Conjunctivae and EOM are normal. Pupils are equal, round, and reactive to light. Right eye exhibits no discharge. Left eye exhibits no discharge. No scleral icterus.  Neck: Normal range of motion. Neck supple. No tracheal deviation present. No thyromegaly present.  Cardiovascular: Normal rate, regular rhythm, normal heart sounds and intact distal pulses.  Exam reveals no gallop and no friction rub.   No murmur heard. Respiratory: Effort normal and breath sounds normal. No respiratory distress. She has no wheezes. She has no rales. She exhibits no tenderness.  GI: Soft. Bowel sounds are normal. She exhibits no distension and no mass. There is tenderness. There is no rebound and no guarding.  Genitourinary:       Vulva is normal without lesions Vagina is pink moist without discharge Cervix normal in appearance and pap is normal Uterus is normal size, contour, position, consistency, mobility, non-tender Adnexa is negative with normal sized ovaries by sonogram  Musculoskeletal: Normal range of motion. She exhibits no edema and no tenderness.  Neurological: She is alert and oriented to person, place, and time. She has normal reflexes. She displays normal reflexes. No cranial nerve deficit. She exhibits normal muscle tone. Coordination normal.  Skin: Skin is warm and dry. No rash noted. No erythema. No pallor.  Psychiatric: She has a normal mood and affect. Her behavior is normal. Judgment and thought content normal.    Labs: No results found for this or any previous visit (from the past 336 hour(s)).  EKG: No orders found for this or any previous visit.  Imaging Studies: No results found.    Assessment: Multiparous female who desires permanent sterilization: pt would like salpingectomy for ovarian cancer prophylaxis but undeerstands fo rthe time being that is not an option, she wants to go ahead with the surgery anyway but will have bipolar cautery fulguration  Patient Active Problem List    Diagnosis Date Noted  . 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 BV (bacterial vaginosis) 03/26/2016  . ASCUS with positive high risk HPV cervical 03/13/2016    Plan: Laparoscopic bilateral sterilization using electrocautery 12/19/2016  EURE,LUTHER H 12/14/2016 12:56 PM

## 2016-12-14 NOTE — Patient Instructions (Signed)
Sydney Alexander  12/14/2016     @PREFPERIOPPHARMACY @   Your procedure is scheduled on  12/19/2016   Report to [facility] at  700  A.M.  Call this number if you have problems the morning of surgery:  [phone]   Remember:  Do not eat food or drink liquids after midnight.  Take these medicines the morning of surgery with A SIP OF WATER  None   Do not wear jewelry, make-up or nail polish.  Do not wear lotions, powders, or perfumes, or deoderant.  Do not shave 48 hours prior to surgery.  Men may shave face and neck.  Do not bring valuables to the hospital.  [facility] is not responsible for any belongings or valuables.  Contacts, dentures or bridgework may not be worn into surgery.  Leave your suitcase in the car.  After surgery it may be brought to your room.  For patients admitted to the hospital, discharge time will be determined by your treatment team.  Patients discharged the day of surgery will not be allowed to drive home.   Name and phone number of your driver:   family Special instructions:  None  Please read over the following fact sheets that you were given. Anesthesia Post-op Instructions and Care and Recovery After Surgery       Laparoscopic Tubal Ligation Introduction Laparoscopic tubal ligation is a procedure to close the fallopian tubes. This is done so that you cannot get pregnant. When the fallopian tubes are closed, the eggs that your ovaries release cannot enter the uterus, and sperm cannot reach the released eggs. A laparoscopic tubal ligation is sometimes called "getting your tubes tied." You should not have this procedure if you want to get pregnant someday or if you are unsure about having more children. Tell a health care provider about:  Any allergies you have.  All medicines you are taking, including vitamins, herbs, eye drops, creams, and over-the-counter medicines.  Any problems you or family members have had with anesthetic  medicines.  Any blood disorders you have.  Any surgeries you have had.  Any medical conditions you have.  Whether you are pregnant or may be pregnant.  Any past pregnancies. What are the risks? Generally, this is a safe procedure. However, problems may occur, including:  Infection.  Bleeding.  Injury to surrounding organs.  Side effects from anesthetics.  Failure of the procedure. This procedure can increase your risk of a kind of pregnancy in which a fertilized egg attaches to the outside of the uterus (ectopic pregnancy). What happens before the procedure?  Ask your health care provider about:  Changing or stopping your regular medicines. This is especially important if you are taking diabetes medicines or blood thinners.  Taking medicines such as aspirin and ibuprofen. These medicines can thin your blood. Do not take these medicines before your procedure if your health care provider instructs you not to.  Follow instructions from your health care provider about eating and drinking restrictions.  Plan to have someone take you home after the procedure.  If you go home right after the procedure, plan to have someone with you for 24 hours. What happens during the procedure?  You will be given one or more of the following:  A medicine to help you relax (sedative).  A medicine to numb the area (local anesthetic).  A medicine to make you fall asleep (general anesthetic).  A medicine that is  injected into an area of your body to numb everything below the injection site (regional anesthetic).  An IV tube will be inserted into one of your veins. It will be used to give you medicines and fluids during the procedure.  Your bladder may be emptied with a small tube (catheter).  If you have been given a general anesthetic, a tube will be put down your throat to help you breathe.  Two small cuts (incisions) will be made in your lower abdomen and near your belly  button.  Your abdomen will be inflated with a gas. This will let the surgeon see better and will give the surgeon room to work.  A thin, lighted tube (laparoscope) with a camera attached will be inserted into your abdomen through one of the incisions. Small instruments will be inserted through the other incision.  The fallopian tubes will be tied off, burned (cauterized), or blocked with a clip, ring, or clamp. A small portion in the center of each fallopian tube may be removed.  The gas will be released from the abdomen.  The incisions will be closed with stitches (sutures).  A bandage (dressing) will be placed over the incisions. The procedure may vary among health care providers and hospitals. What happens after the procedure?  Your blood pressure, heart rate, breathing rate, and blood oxygen level will be monitored often until the medicines you were given have worn off.  You will be given medicine to help with pain, nausea, and vomiting as needed. This information is not intended to replace advice given to you by your health care provider. Make sure you discuss any questions you have with your health care provider. Document Released: 01/14/2001 Document Revised: 03/15/2016 Document Reviewed: 09/18/2015  2017 Elsevier  Laparoscopic Tubal Ligation, Care After Refer to this sheet in the next few weeks. These instructions provide you with information about caring for yourself after your procedure. Your health care provider may also give you more specific instructions. Your treatment has been planned according to current medical practices, but problems sometimes occur. Call your health care provider if you have any problems or questions after your procedure. What can I expect after the procedure? After the procedure, it is common to have:  A sore throat.  Discomfort in your shoulder.  Mild discomfort or cramping in your abdomen.  Gas pains.  Pain or soreness in the area where the  surgical cut (incision) was made.  A bloated feeling.  Tiredness.  Nausea.  Vomiting. Follow these instructions at home: Medicines  Take over-the-counter and prescription medicines only as told by your health care provider.  Do not take aspirin because it can cause bleeding.  Do not drive or operate heavy machinery while taking prescription pain medicine. Activity  Rest for the rest of the day.  Return to your normal activities as told by your health care provider. Ask your health care provider what activities are safe for you. Incision care  Follow instructions from your health care provider about how to take care of your incision. Make sure you:  Wash your hands with soap and water before you change your bandage (dressing). If soap and water are not available, use hand sanitizer.  Change your dressing as told by your health care provider.  Leave stitches (sutures) in place. They may need to stay in place for 2 weeks or longer.  Check your incision area every day for signs of infection. Check for:  More redness, swelling, or pain.  More fluid  or blood.  Warmth.  Pus or a bad smell. Other Instructions  Do not take baths, swim, or use a hot tub until your health care provider approves. You may take showers.  Keep all follow-up visits as told by your health care provider. This is important.  Have someone help you with your daily household tasks for the first few days. Contact a health care provider if:  You have more redness, swelling, or pain around your incision.  Your incision feels warm to the touch.  You have pus or a bad smell coming from your incision.  The edges of your incision break open after the sutures have been removed.  Your pain does not improve after 2-3 days.  You have a rash.  You repeatedly become dizzy or light-headed.  Your pain medicine is not helping.  You are constipated. Get help right away if:  You have a fever.  You  faint.  You have increasing pain in your abdomen.  You have severe pain in one or both of your shoulders.  You have fluid or blood coming from your sutures or from your vagina.  You have shortness of breath or difficulty breathing.  You have chest pain or leg pain.  You have ongoing nausea, vomiting, or diarrhea. This information is not intended to replace advice given to you by your health care provider. Make sure you discuss any questions you have with your health care provider. Document Released: 04/27/2005 Document Revised: 03/12/2016 Document Reviewed: 09/18/2015 Elsevier Interactive Patient Education  2017 [employer]vier Inc[employer]  General Anesthesia, Adult General anesthesia is the use of medicines to make a person "go to sleep" (be unconscious) for a medical procedure. General anesthesia is often recommended when a procedure:  Is long.  Requires you to be still or in an unusual position.  Is major and can cause you to lose blood.  Is impossible to do without general anesthesia. The medicines used for general anesthesia are called general anesthetics. In addition to making you sleep, the medicines:  Prevent pain.  Control your blood pressure.  Relax your muscles. Tell a health care provider about:  Any allergies you have.  All medicines you are taking, including vitamins, herbs, eye drops, creams, and over-the-counter medicines.  Any problems you or family members have had with anesthetic medicines.  Types of anesthetics you have had in the past.  Any bleeding disorders you have.  Any surgeries you have had.  Any medical conditions you have.  Any history of heart or lung conditions, such as heart failure, sleep apnea, or chronic obstructive pulmonary disease (COPD).  Whether you are pregnant or may be pregnant.  Whether you use tobacco, alcohol, marijuana, or street drugs.  Any history of Financial plannermilitary service.  Any history of depression or anxiety. What are the  risks? Generally, this is a safe procedure. However, problems may occur, including:  Allergic reaction to anesthetics.  Lung and heart problems.  Inhaling food or liquids from your stomach into your lungs (aspiration).  Injury to nerves.  Waking up during your procedure and being unable to move (rare).  Extreme agitation or a state of mental confusion (delirium) when you wake up from the anesthetic.  Air in the bloodstream, which can lead to stroke. These problems are more likely to develop if you are having a major surgery or if you have an advanced medical condition. You can prevent some of these complications by answering all of your health care provider's questions thoroughly and by following  all pre-procedure instructions. General anesthesia can cause side effects, including:  Nausea or vomiting  A sore throat from the breathing tube.  Feeling cold or shivery.  Feeling tired, washed out, or achy.  Sleepiness or drowsiness.  Confusion or agitation. What happens before the procedure? Staying hydrated  Follow instructions from your health care provider about hydration, which may include:  Up to 2 hours before the procedure - you may continue to drink clear liquids, such as water, clear fruit juice, black coffee, and plain tea. Eating and drinking restrictions  Follow instructions from your health care provider about eating and drinking, which may include:  8 hours before the procedure - stop eating heavy meals or foods such as meat, fried foods, or fatty foods.  6 hours before the procedure - stop eating light meals or foods, such as toast or cereal.  6 hours before the procedure - stop drinking milk or drinks that contain milk.  2 hours before the procedure - stop drinking clear liquids. Medicines  Ask your health care provider about:  Changing or stopping your regular medicines. This is especially important if you are taking diabetes medicines or blood  thinners.  Taking medicines such as aspirin and ibuprofen. These medicines can thin your blood. Do not take these medicines before your procedure if your health care provider instructs you not to.  Taking new dietary supplements or medicines. Do not take these during the week before your procedure unless your health care provider approves them.  If you are told to take a medicine or to continue taking a medicine on the day of the procedure, take the medicine with sips of water. General instructions   Ask if you will be going home the same day, the following day, or after a longer hospital stay.  Plan to have someone take you home.  Plan to have someone stay with you for the first 24 hours after you leave the hospital or clinic.  For 3-6 weeks before the procedure, try not to use any tobacco products, such as cigarettes, chewing tobacco, and e-cigarettes.  You may brush your teeth on the morning of the procedure, but make sure to spit out the toothpaste. What happens during the procedure?  You will be given anesthetics through a mask and through an IV tube in one of your veins.  You may receive medicine to help you relax (sedative).  As soon as you are asleep, a breathing tube may be used to help you breathe.  An anesthesia specialist will stay with you throughout the procedure. He or she will help keep you comfortable and safe by continuing to give you medicines and adjusting the amount of medicine that you get. He or she will also watch your blood pressure, pulse, and oxygen levels to make sure that the anesthetics do not cause any problems.  If a breathing tube was used to help you breathe, it will be removed before you wake up. The procedure may vary among health care providers and hospitals. What happens after the procedure?  You will wake up, often slowly, after the procedure is complete, usually in a recovery area.  Your blood pressure, heart rate, breathing rate, and blood  oxygen level will be monitored until the medicines you were given have worn off.  You may be given medicine to help you calm down if you feel anxious or agitated.  If you will be going home the same day, your health care provider may check to make sure  you can stand, drink, and urinate.  Your health care providers will treat your pain and side effects before you go home.  Do not drive for 24 hours if you received a sedative.  You may:  Feel nauseous and vomit.  Have a sore throat.  Have mental slowness.  Feel cold or shivery.  Feel sleepy.  Feel tired.  Feel sore or achy, even in parts of your body where you did not have surgery. This information is not intended to replace advice given to you by your health care provider. Make sure you discuss any questions you have with your health care provider. Document Released: 01/15/2008 Document Revised: 03/20/2016 Document Reviewed: 09/22/2015 Elsevier Interactive Patient Education  2017 Elsevier Inc. General Anesthesia, Adult, Care After These instructions provide you with information about caring for yourself after your procedure. Your health care provider may also give you more specific instructions. Your treatment has been planned according to current medical practices, but problems sometimes occur. Call your health care provider if you have any problems or questions after your procedure. What can I expect after the procedure? After the procedure, it is common to have:  Vomiting.  A sore throat.  Mental slowness. It is common to feel:  Nauseous.  Cold or shivery.  Sleepy.  Tired.  Sore or achy, even in parts of your body where you did not have surgery. Follow these instructions at home: For at least 24 hours after the procedure:  Do not:  Participate in activities where you could fall or become injured.  Drive.  Use heavy machinery.  Drink alcohol.  Take sleeping pills or medicines that cause  drowsiness.  Make important decisions or sign legal documents.  Take care of children on your own.  Rest. Eating and drinking  If you vomit, drink water, juice, or soup when you can drink without vomiting.  Drink enough fluid to keep your urine clear or pale yellow.  Make sure you have little or no nausea before eating solid foods.  Follow the diet recommended by your health care provider. General instructions  Have a responsible adult stay with you until you are awake and alert.  Return to your normal activities as told by your health care provider. Ask your health care provider what activities are safe for you.  Take over-the-counter and prescription medicines only as told by your health care provider.  If you smoke, do not smoke without supervision.  Keep all follow-up visits as told by your health care provider. This is important. Contact a health care provider if:  You continue to have nausea or vomiting at home, and medicines are not helpful.  You cannot drink fluids or start eating again.  You cannot urinate after 8-12 hours.  You develop a skin rash.  You have fever.  You have increasing redness at the site of your procedure. Get help right away if:  You have difficulty breathing.  You have chest pain.  You have unexpected bleeding.  You feel that you are having a life-threatening or urgent problem. This information is not intended to replace advice given to you by your health care provider. Make sure you discuss any questions you have with your health care provider. Document Released: 01/14/2001 Document Revised: 03/12/2016 Document Reviewed: 09/22/2015 Elsevier Interactive Patient Education  2017 [employer].

## 2016-12-17 ENCOUNTER — Encounter (HOSPITAL_COMMUNITY): Payer: Self-pay

## 2016-12-17 ENCOUNTER — Other Ambulatory Visit: Payer: Self-pay | Admitting: Obstetrics & Gynecology

## 2016-12-17 ENCOUNTER — Encounter (HOSPITAL_COMMUNITY)
Admission: RE | Admit: 2016-12-17 | Discharge: 2016-12-17 | Disposition: A | Discharge status: Home / Self Care | Payer: Medicaid Other | Source: Ambulatory Visit | Attending: Obstetrics & Gynecology | Admitting: Obstetrics & Gynecology

## 2016-12-17 DIAGNOSIS — Z01818 Encounter for other preprocedural examination: Secondary | ICD-10-CM | POA: Diagnosis present

## 2016-12-17 LAB — COMPREHENSIVE METABOLIC PANEL
ALT: 33 U/L (ref 14–54)
AST: 23 U/L (ref 15–41)
Albumin: 3.7 g/dL (ref 3.5–5.0)
Alkaline Phosphatase: 55 U/L (ref 38–126)
Anion gap: 6 (ref 5–15)
BUN: 8 mg/dL (ref 6–20)
CHLORIDE: 104 mmol/L (ref 101–111)
CO2: 28 mmol/L (ref 22–32)
Calcium: 8.9 mg/dL (ref 8.9–10.3)
Creatinine, Ser: 0.65 mg/dL (ref 0.44–1.00)
GFR calc Af Amer: >60 mL/min (ref >60)
Glucose, Bld: 83 mg/dL (ref 65–99)
POTASSIUM: 3.2 mmol/L — AB (ref 3.5–5.1)
SODIUM: 138 mmol/L (ref 135–145)
Total Bilirubin: 0.3 mg/dL (ref 0.3–1.2)
Total Protein: 6.9 g/dL (ref 6.5–8.1)

## 2016-12-17 LAB — URINALYSIS, ROUTINE W REFLEX MICROSCOPIC
Bilirubin Urine: NEGATIVE
GLUCOSE, UA: NEGATIVE mg/dL
HGB URINE DIPSTICK: NEGATIVE
Ketones, ur: NEGATIVE mg/dL
Nitrite: NEGATIVE
Protein, ur: NEGATIVE mg/dL
SPECIFIC GRAVITY, URINE: 1.026 (ref 1.005–1.030)
pH: 6 (ref 5.0–8.0)

## 2016-12-17 LAB — CBC
HCT: 37.7 % (ref 36.0–46.0)
Hemoglobin: 12.8 g/dL (ref 12.0–15.0)
MCH: 28.8 pg (ref 26.0–34.0)
MCHC: 34 g/dL (ref 30.0–36.0)
MCV: 84.9 fL (ref 78.0–100.0)
PLATELETS: 231 10*3/uL (ref 150–400)
RBC: 4.44 MIL/uL (ref 3.87–5.11)
RDW: 13.6 % (ref 11.5–15.5)
WBC: 4.5 10*3/uL (ref 4.0–10.5)

## 2016-12-17 LAB — RAPID HIV SCREEN (HIV 1/2 AB+AG)
HIV 1/2 Antibodies: NONREACTIVE
HIV-1 P24 ANTIGEN - HIV24: NONREACTIVE

## 2016-12-17 LAB — HCG, QUANTITATIVE, PREGNANCY

## 2016-12-18 NOTE — Pre-Procedure Instructions (Signed)
Potassium of 3.2 shown to Dr Gonzalez. No orders given. 

## 2016-12-18 NOTE — Anesthesia Preprocedure Evaluation (Addendum)
Anesthesia Evaluation  Patient identified by MRN, date of birth, ID band Patient awake    Reviewed: Allergy & Precautions, NPO status , Patient's Chart, lab work & pertinent test results  Airway Mallampati: II  TM Distance: >3 FB Neck ROM: Full    Dental no notable dental hx.    Pulmonary neg pulmonary ROS,    Pulmonary exam normal breath sounds clear to auscultation       Cardiovascular negative cardio ROS Normal cardiovascular exam Rhythm:Regular Rate:Normal     Neuro/Psych PSYCHIATRIC DISORDERS Depression    GI/Hepatic negative GI ROS,   Endo/Other  negative endocrine ROS  Renal/GU      Musculoskeletal   Abdominal (+) + obese,   Peds  Hematology   Anesthesia Other Findings   Reproductive/Obstetrics                            Anesthesia Physical Anesthesia Plan  ASA: II  Anesthesia Plan: General   Post-op Pain Management:    Induction: Intravenous  Airway Management Planned: Oral ETT  Additional Equipment:   Intra-op Plan:   Post-operative Plan: Extubation in OR  Informed Consent: I have reviewed the patients History and Physical, chart, labs and discussed the procedure including the risks, benefits and alternatives for the proposed anesthesia with the patient or authorized representative who has indicated his/her understanding and acceptance.     Plan Discussed with:   Anesthesia Plan Comments:         Anesthesia Quick Evaluation

## 2016-12-19 ENCOUNTER — Ambulatory Visit (HOSPITAL_COMMUNITY): Payer: Medicaid Other | Admitting: Anesthesiology

## 2016-12-19 ENCOUNTER — Encounter (HOSPITAL_COMMUNITY): Payer: Self-pay | Admitting: *Deleted

## 2016-12-19 ENCOUNTER — Encounter (HOSPITAL_COMMUNITY)
Admission: RE | Disposition: A | Discharge status: Home / Self Care | Payer: Self-pay | Source: Ambulatory Visit | Attending: Obstetrics & Gynecology

## 2016-12-19 ENCOUNTER — Ambulatory Visit (HOSPITAL_COMMUNITY)
Admission: RE | Admit: 2016-12-19 | Discharge: 2016-12-19 | Disposition: A | Discharge status: Home / Self Care | Payer: Medicaid Other | Source: Ambulatory Visit | Attending: Obstetrics & Gynecology | Admitting: Obstetrics & Gynecology

## 2016-12-19 DIAGNOSIS — K429 Umbilical hernia without obstruction or gangrene: Secondary | ICD-10-CM | POA: Insufficient documentation

## 2016-12-19 DIAGNOSIS — Z302 Encounter for sterilization: Secondary | ICD-10-CM | POA: Insufficient documentation

## 2016-12-19 HISTORY — PX: LAPAROSCOPIC TUBAL LIGATION: SHX1937

## 2016-12-19 SURGERY — LIGATION, FALLOPIAN TUBE, LAPAROSCOPIC
Anesthesia: General | Laterality: Bilateral

## 2016-12-19 MED ORDER — FENTANYL CITRATE (PF) 250 MCG/5ML IJ SOLN
INTRAMUSCULAR | Status: AC
  Filled 2016-12-19: qty 5

## 2016-12-19 MED ORDER — BUPIVACAINE LIPOSOME 1.3 % IJ SUSP
INTRAMUSCULAR | Status: AC
  Filled 2016-12-19: qty 20

## 2016-12-19 MED ORDER — KETOROLAC TROMETHAMINE 30 MG/ML IJ SOLN
30.0000 mg | Freq: Once | INTRAMUSCULAR | Status: AC
  Administered 2016-12-19: 30 mg via INTRAVENOUS
  Filled 2016-12-19: qty 1

## 2016-12-19 MED ORDER — ONDANSETRON 8 MG PO TBDP: 8.0000 mg | ORAL_TABLET | Freq: Three times a day (TID) | ORAL | 0 refills | Status: DC | PRN

## 2016-12-19 MED ORDER — HYDROMORPHONE HCL 1 MG/ML IJ SOLN: 0.2500 mg | INTRAMUSCULAR | Status: DC | PRN

## 2016-12-19 MED ORDER — NEOSTIGMINE METHYLSULFATE 10 MG/10ML IV SOLN
INTRAVENOUS | Status: AC
  Filled 2016-12-19: qty 1

## 2016-12-19 MED ORDER — ROCURONIUM BROMIDE 50 MG/5ML IV SOLN
INTRAVENOUS | Status: AC
  Filled 2016-12-19: qty 1

## 2016-12-19 MED ORDER — KETOROLAC TROMETHAMINE 10 MG PO TABS: 10.0000 mg | ORAL_TABLET | Freq: Three times a day (TID) | ORAL | 0 refills | Status: DC | PRN

## 2016-12-19 MED ORDER — ARTIFICIAL TEARS OP OINT
TOPICAL_OINTMENT | OPHTHALMIC | Status: DC | PRN
  Administered 2016-12-19: 1 via OPHTHALMIC

## 2016-12-19 MED ORDER — MEPERIDINE HCL 50 MG/ML IJ SOLN
10.0000 mg | Freq: Once | INTRAMUSCULAR | Status: AC
  Administered 2016-12-19: 10 mg via INTRAVENOUS

## 2016-12-19 MED ORDER — LIDOCAINE HCL (PF) 1 % IJ SOLN
INTRAMUSCULAR | Status: AC
  Filled 2016-12-19: qty 5

## 2016-12-19 MED ORDER — MIDAZOLAM HCL 2 MG/2ML IJ SOLN
1.0000 mg | INTRAMUSCULAR | Status: AC
  Administered 2016-12-19 (×2): 1 mg via INTRAVENOUS
  Filled 2016-12-19: qty 2

## 2016-12-19 MED ORDER — CEFAZOLIN SODIUM-DEXTROSE 2-4 GM/100ML-% IV SOLN
2.0000 g | INTRAVENOUS | Status: AC
  Administered 2016-12-19: 2 g via INTRAVENOUS
  Filled 2016-12-19: qty 100

## 2016-12-19 MED ORDER — PROPOFOL 10 MG/ML IV BOLUS
INTRAVENOUS | Status: AC
  Filled 2016-12-19: qty 40

## 2016-12-19 MED ORDER — LIDOCAINE HCL (CARDIAC) 20 MG/ML IV SOLN
INTRAVENOUS | Status: DC | PRN
  Administered 2016-12-19: 40 mg via INTRAVENOUS

## 2016-12-19 MED ORDER — ROCURONIUM BROMIDE 100 MG/10ML IV SOLN
INTRAVENOUS | Status: DC | PRN
  Administered 2016-12-19: 30 mg via INTRAVENOUS

## 2016-12-19 MED ORDER — NEOSTIGMINE METHYLSULFATE 10 MG/10ML IV SOLN
INTRAVENOUS | Status: DC | PRN
  Administered 2016-12-19: 4 mg via INTRAVENOUS

## 2016-12-19 MED ORDER — 0.9 % SODIUM CHLORIDE (POUR BTL) OPTIME
TOPICAL | Status: DC | PRN
  Administered 2016-12-19: 1000 mL

## 2016-12-19 MED ORDER — FENTANYL CITRATE (PF) 100 MCG/2ML IJ SOLN
INTRAMUSCULAR | Status: DC | PRN
  Administered 2016-12-19 (×3): 50 ug via INTRAVENOUS

## 2016-12-19 MED ORDER — SUCCINYLCHOLINE CHLORIDE 20 MG/ML IJ SOLN
INTRAMUSCULAR | Status: AC
  Filled 2016-12-19: qty 1

## 2016-12-19 MED ORDER — HYDROCODONE-ACETAMINOPHEN 5-325 MG PO TABS: 1.0000 | ORAL_TABLET | Freq: Four times a day (QID) | ORAL | 0 refills | Status: DC | PRN

## 2016-12-19 MED ORDER — LACTATED RINGERS IV SOLN
INTRAVENOUS | Status: DC
  Administered 2016-12-19: 08:00:00 via INTRAVENOUS

## 2016-12-19 MED ORDER — MEPERIDINE HCL 50 MG/ML IJ SOLN
INTRAMUSCULAR | Status: AC
  Filled 2016-12-19: qty 1

## 2016-12-19 MED ORDER — GLYCOPYRROLATE 0.2 MG/ML IJ SOLN
INTRAMUSCULAR | Status: AC
  Filled 2016-12-19: qty 3

## 2016-12-19 MED ORDER — PROPOFOL 10 MG/ML IV BOLUS
INTRAVENOUS | Status: DC | PRN
Start: 2016-12-19 — End: 2016-12-19
  Administered 2016-12-19: 150 mg via INTRAVENOUS

## 2016-12-19 MED ORDER — GLYCOPYRROLATE 0.2 MG/ML IJ SOLN
INTRAMUSCULAR | Status: DC | PRN
  Administered 2016-12-19: 0.6 mg via INTRAVENOUS

## 2016-12-19 SURGICAL SUPPLY — 35 items
BAG HAMPER (MISCELLANEOUS) ×3 IMPLANT
BLADE SURG SZ11 CARB STEEL (BLADE) ×3 IMPLANT
CLOTH BEACON ORANGE TIMEOUT ST (SAFETY) ×3 IMPLANT
COVER LIGHT HANDLE STERIS (MISCELLANEOUS) ×6 IMPLANT
ELECT REM PT RETURN 9FT ADLT (ELECTROSURGICAL) ×3
ELECTRODE REM PT RTRN 9FT ADLT (ELECTROSURGICAL) ×1 IMPLANT
GLOVE BIO SURGEON STRL SZ 6.5 (GLOVE) ×1 IMPLANT
GLOVE BIO SURGEONS STRL SZ 6.5 (GLOVE) ×1
GLOVE BIOGEL PI IND STRL 6.5 (GLOVE) IMPLANT
GLOVE BIOGEL PI IND STRL 7.0 (GLOVE) ×1 IMPLANT
GLOVE BIOGEL PI IND STRL 8 (GLOVE) ×1 IMPLANT
GLOVE BIOGEL PI INDICATOR 6.5 (GLOVE) ×2
GLOVE BIOGEL PI INDICATOR 7.0 (GLOVE) ×4
GLOVE BIOGEL PI INDICATOR 8 (GLOVE) ×4
GLOVE ECLIPSE 8.0 STRL XLNG CF (GLOVE) ×5 IMPLANT
GOWN STRL REUS W/TWL LRG LVL3 (GOWN DISPOSABLE) ×10 IMPLANT
GOWN STRL REUS W/TWL XL LVL3 (GOWN DISPOSABLE) ×3 IMPLANT
INST SET LAPROSCOPIC GYN AP (KITS) ×3 IMPLANT
KIT ROOM TURNOVER AP CYSTO (KITS) ×3 IMPLANT
MANIFOLD NEPTUNE II (INSTRUMENTS) ×3 IMPLANT
NDL INSUFFLATION 14GA 120MM (NEEDLE) ×1 IMPLANT
NEEDLE INSUFFLATION 14GA 120MM (NEEDLE) ×3 IMPLANT
PACK PERI GYN (CUSTOM PROCEDURE TRAY) ×3 IMPLANT
PAD ARMBOARD 7.5X6 YLW CONV (MISCELLANEOUS) ×3 IMPLANT
SET BASIN LINEN APH (SET/KITS/TRAYS/PACK) ×3 IMPLANT
SOLUTION ANTI FOG 6CC (MISCELLANEOUS) ×3 IMPLANT
SPONGE GAUZE 2X2 8PLY STER LF (GAUZE/BANDAGES/DRESSINGS) ×1
SPONGE GAUZE 2X2 8PLY STRL LF (GAUZE/BANDAGES/DRESSINGS) ×2 IMPLANT
STAPLER VISISTAT 35W (STAPLE) ×3 IMPLANT
SUT VICRYL 0 UR6 27IN ABS (SUTURE) ×3 IMPLANT
SYRINGE 10CC LL (SYRINGE) ×2 IMPLANT
TAPE CLOTH SURG 4X10 WHT LF (GAUZE/BANDAGES/DRESSINGS) ×2 IMPLANT
TROCAR ENDO BLADELESS 11MM (ENDOMECHANICALS) ×3 IMPLANT
TUBING INSUFFLATION (TUBING) ×3 IMPLANT
WARMER LAPAROSCOPE (MISCELLANEOUS) ×3 IMPLANT

## 2016-12-19 NOTE — Anesthesia Procedure Notes (Signed)
Procedure Name: Intubation Date/Time: 12/19/2016 8:32 AM Performed by: Pernell DupreADAMS, AMY A Pre-anesthesia Checklist: Patient identified, Patient being monitored, Timeout performed, Emergency Drugs available and Suction available Patient Re-evaluated:Patient Re-evaluated prior to inductionOxygen Delivery Method: Circle System Utilized Preoxygenation: Pre-oxygenation with 100% oxygen Intubation Type: IV induction Ventilation: Mask ventilation without difficulty Laryngoscope Size: Miller and 3 Grade View: Grade I Tube type: Oral Tube size: 7.0 mm Number of attempts: 1 Airway Equipment and Method: Stylet Placement Confirmation: ETT inserted through vocal cords under direct vision,  positive ETCO2 and breath sounds checked- equal and bilateral Secured at: 21 cm Tube secured with: Tape Dental Injury: Teeth and Oropharynx as per pre-operative assessment

## 2016-12-19 NOTE — Transfer of Care (Signed)
Immediate Anesthesia Transfer of Care Note  Patient: Sydney Alexander  Procedure(s) Performed: Procedure(s): LAPAROSCOPIC BILATERAL TUBAL LIGATION USING ELECTROCAUTERY (Bilateral)  Patient Location: PACU  Anesthesia Type:General  Level of Consciousness: awake, oriented and patient cooperative  Airway & Oxygen Therapy: Patient Spontanous Breathing and Patient connected to face mask oxygen  Post-op Assessment: Report given to RN and Post -op Vital signs reviewed and stable  Post vital signs: Reviewed and stable  Last Vitals:  Vitals:   12/19/16 0810 12/19/16 0815  BP: 112/76   Resp: 15 14  Temp:      Last Pain:  Vitals:   12/19/16 0724  TempSrc: Oral      Patients Stated Pain Goal: 9 (12/19/16 0724)  Complications: No apparent anesthesia complications

## 2016-12-19 NOTE — Op Note (Signed)
Preoperative Diagnosis:  Multiparous female desires permanent sterilization                                          (Pt desired bilateral salpingectomy for ovarian cancer prophylaxis but after being informed of the current Medicaid policy still wanted a permanent sterilization procedure and opted for bilateral fulguration using electrocautery)   Postoperativetive Diagnosis:  Same as above + small umbilical hernia with fat in the hernia sac  Procedure:  Laparoscopic Bilateral Tubal Ligation using electrocautery, repair of umbilical fascial defect  Surgeon:  Rockne CoonsLuther H Delisa Finck  Jr MD  Anaesthesia:  LMA  Findings:  Patient had normal pelvic anatomy and no intraperitoneal abnormalities. She has a small 1 cm umbilical hernia with fat in the fascial defect  Description of Operation:  Patient was taken to the OR and placed into supine position where she underwent LMA anaesthesia.  She was placed in the dorsal lithotomy position and prepped and draped in the usual sterile fashion.  An incision was made in the umbilicus and dissection taken down to the rectus fascia which was incised and opened.  The non bladed trocar was then placed and the peritoneal cavity was insufflated.  The above noted findings were observed.  The Kleppinger electrocautery unit was employed and both tubes were burned to no resistance and beyond in the distal ishtmic and ampullary regions, approximately a 3.5 cm segment bilaterally.  There was good hemostasis.  The fascia, peritoneum and subcutaneous tissue were closed using 0 vicryl. It was reinforced with a running suture of the fascia due to the small infraumblical fascial defect.  The skin was closed using staples.  The patient was awakened from anaesthesia and taken to the PACU with all counts being correct x 3.  The patient received Ancef 2 gram and Toradol 30 mg IV preoperatively.  There was minimal blood loss  Lazaro ArmsEURE,Benelli Winther H, MD 12/19/2016 9:31 AM

## 2016-12-19 NOTE — Anesthesia Postprocedure Evaluation (Signed)
Anesthesia Post Note  Patient: Sydney Alexander  Procedure(s) Performed: Procedure(s) (LRB): LAPAROSCOPIC BILATERAL TUBAL LIGATION USING ELECTROCAUTERY (Bilateral)  Patient location during evaluation: PACU Anesthesia Type: General Level of consciousness: awake and alert and oriented Pain management: pain level controlled Vital Signs Assessment: post-procedure vital signs reviewed and stable Respiratory status: spontaneous breathing Cardiovascular status: stable Postop Assessment: no signs of nausea or vomiting Anesthetic complications: no     Last Vitals:  Vitals:   12/19/16 0815 12/19/16 0937  BP:    Resp: 14 (P) 10  Temp:  36.5 C    Last Pain:  Vitals:   12/19/16 0724  TempSrc: Oral                 ADAMS, AMY A

## 2016-12-19 NOTE — H&P (Addendum)
Preoperative History and Physical  Sydney Alexander is a 31 y.o. [id number] with Patient's last menstrual period was 12/07/2016. admitted for a laparoscopic bilateral sterilization procedure.  Patient was counseled prior to seeing me regarding contraception, including IUD, nexplanon, OCP, depo provera, etc.  She still wanted a permanent sterilization procedure.  She was then counseled by me regarding different techniques for sterilization.  She chose to have a bilateral salpingectomy and in fact was brought back in for a second counseling session when it was discovered at this point that medicaid will not cover salpingectomy as a sterilization procedure.  The patient was not happy with this because she wanted it done for ovarian cancer prophylaxis.  We discussed non operative contraception again and she still wants an operative sterilization and opts for a laparoscopic bilateral tubal ligation using electrocautery.  PMH:        Past Medical History:  Diagnosis Date  . Abnormal pap   . BV (bacterial vaginosis)   . Contraceptive management 03/10/2015  . Cramping affecting pregnancy, antepartum 03/26/2016  . Encounter for IUD removal 03/10/2015  . Vaginal discharge 03/26/2016  . Vaginal irritation 03/26/2016  . Vaginal Pap smear, abnormal     PSH:          Past Surgical History:  Procedure Laterality Date  . NO PAST SURGERIES      POb/GynH:              OB History    Gravida Para Term Preterm AB Living   3 3 3     3    SAB TAB Ectopic Multiple Live Births         0 3      SH:       Social History  Substance Use Topics  . Smoking status: Never Smoker  . Smokeless tobacco: Never Used  . Alcohol use No    FH:          Family History  Problem Relation Age of Onset  . Cancer Maternal Grandmother   . Thyroid disease Mother   . Cancer Paternal Aunt   . Diabetes Other     lots of aunts and uncles on both sides     Allergies: No Known  Allergies  Medications:       Current Outpatient Prescriptions:  Marland Kitchen.  Multiple Vitamin (MULTIVITAMIN) tablet, Take 1 tablet by mouth daily., Disp: , Rfl:   Review of Systems:   Review of Systems  Constitutional: Negative for fever, chills, weight loss, malaise/fatigue and diaphoresis.  HENT: Negative for hearing loss, ear pain, nosebleeds, congestion, sore throat, neck pain, tinnitus and ear discharge.   Eyes: Negative for blurred vision, double vision, photophobia, pain, discharge and redness.  Respiratory: Negative for cough, hemoptysis, sputum production, shortness of breath, wheezing and stridor.   Cardiovascular: Negative for chest pain, palpitations, orthopnea, claudication, leg swelling and PND.  Gastrointestinal: negative for abdominal pain. Negative for heartburn, nausea, vomiting, diarrhea, constipation, blood in stool and melena.  Genitourinary: Negative for dysuria, urgency, frequency, hematuria and flank pain.  Musculoskeletal: Negative for myalgias, back pain, joint pain and falls.  Skin: Negative for itching and rash.  Neurological: Negative for dizziness, tingling, tremors, sensory change, speech change, focal weakness, seizures, loss of consciousness, weakness and headaches.  Endo/Heme/Allergies: Negative for environmental allergies and polydipsia. Does not bruise/bleed easily.  Psychiatric/Behavioral: Negative for depression, suicidal ideas, hallucinations, memory loss and substance abuse. The patient is not nervous/anxious and does not have insomnia.  PHYSICAL EXAM:  Blood pressure 110/80, pulse 80, height 5\' 1"  (1.549 m), weight 178 lb (80.7 kg), last menstrual period 12/07/2016, currently breastfeeding.    Vitals reviewed. Constitutional: She is oriented to person, place, and time. She appears well-developed and well-nourished.  HENT:  Head: Normocephalic and atraumatic.  Right Ear: External ear normal.  Left Ear: External ear normal.  Nose: Nose  normal.  Mouth/Throat: Oropharynx is clear and moist.  Eyes: Conjunctivae and EOM are normal. Pupils are equal, round, and reactive to light. Right eye exhibits no discharge. Left eye exhibits no discharge. No scleral icterus.  Neck: Normal range of motion. Neck supple. No tracheal deviation present. No thyromegaly present.  Cardiovascular: Normal rate, regular rhythm, normal heart sounds and intact distal pulses.  Exam reveals no gallop and no friction rub.   No murmur heard. Respiratory: Effort normal and breath sounds normal. No respiratory distress. She has no wheezes. She has no rales. She exhibits no tenderness.  GI: Soft. Bowel sounds are normal. She exhibits no distension and no mass. There is tenderness. There is no rebound and no guarding.  Genitourinary:       Vulva is normal without lesions Vagina is pink moist without discharge Cervix normal in appearance and pap is normal Uterus is normal size, contour, position, consistency, mobility, non-tender Adnexa is negative with normal sized ovaries by sonogram  Musculoskeletal: Normal range of motion. She exhibits no edema and no tenderness.  Neurological: She is alert and oriented to person, place, and time. She has normal reflexes. She displays normal reflexes. No cranial nerve deficit. She exhibits normal muscle tone. Coordination normal.  Skin: Skin is warm and dry. No rash noted. No erythema. No pallor.  Psychiatric: She has a normal mood and affect. Her behavior is normal. Judgment and thought content normal.    Labs: Results for orders placed or performed during the hospital encounter of 12/17/16 (from the past 168 hour(s))  CBC   Collection Time: 12/17/16  3:18 PM  Result Value Ref Range   WBC 4.5 4.0 - 10.5 K/uL   RBC 4.44 3.87 - 5.11 MIL/uL   Hemoglobin 12.8 12.0 - 15.0 g/dL   HCT 95.6 21.3 - 08.6 %   MCV 84.9 78.0 - 100.0 fL   MCH 28.8 26.0 - 34.0 pg   MCHC 34.0 30.0 - 36.0 g/dL   RDW 57.8 46.9 - 62.9 %   Platelets  231 150 - 400 K/uL  Comprehensive metabolic panel   Collection Time: 12/17/16  3:18 PM  Result Value Ref Range   Sodium 138 135 - 145 mmol/L   Potassium 3.2 (L) 3.5 - 5.1 mmol/L   Chloride 104 101 - 111 mmol/L   CO2 28 22 - 32 mmol/L   Glucose, Bld 83 65 - 99 mg/dL   BUN 8 6 - 20 mg/dL   Creatinine, Ser 5.28 0.44 - 1.00 mg/dL   Calcium 8.9 8.9 - 41.3 mg/dL   Total Protein 6.9 6.5 - 8.1 g/dL   Albumin 3.7 3.5 - 5.0 g/dL   AST 23 15 - 41 U/L   ALT 33 14 - 54 U/L   Alkaline Phosphatase 55 38 - 126 U/L   Total Bilirubin 0.3 0.3 - 1.2 mg/dL   GFR calc non Af Amer >60 >60 mL/min   GFR calc Af Amer >60 >60 mL/min   Anion gap 6 5 - 15  hCG, quantitative, pregnancy   Collection Time: 12/17/16  3:18 PM  Result Value Ref Range  hCG, Beta Chain, Quant, S <1 <5 mIU/mL  Rapid HIV screen (HIV 1/2 Ab+Ag)   Collection Time: 12/17/16  3:18 PM  Result Value Ref Range   HIV-1 P24 Antigen - HIV24 NON REACTIVE NON REACTIVE   HIV 1/2 Antibodies NON REACTIVE NON REACTIVE   Interpretation (HIV Ag Ab)      A non reactive test result means that HIV 1 or HIV 2 antibodies and HIV 1 p24 antigen were not detected in the specimen.  Urinalysis, Routine w reflex microscopic   Collection Time: 12/17/16  3:20 PM  Result Value Ref Range   Color, Urine YELLOW YELLOW   APPearance HAZY (A) CLEAR   Specific Gravity, Urine 1.026 1.005 - 1.030   pH 6.0 5.0 - 8.0   Glucose, UA NEGATIVE NEGATIVE mg/dL   Hgb urine dipstick NEGATIVE NEGATIVE   Bilirubin Urine NEGATIVE NEGATIVE   Ketones, ur NEGATIVE NEGATIVE mg/dL   Protein, ur NEGATIVE NEGATIVE mg/dL   Nitrite NEGATIVE NEGATIVE   Leukocytes, UA TRACE (A) NEGATIVE   RBC / HPF 0-5 0 - 5 RBC/hpf   WBC, UA 0-5 0 - 5 WBC/hpf   Bacteria, UA RARE (A) NONE SEEN   Mucous PRESENT    .  EKG: No orders found for this or any previous visit.  Imaging Studies: ImagingResults  No results found.      Assessment: Multiparous female who desires permanent  sterilization: pt originally opted for bilateral salpingectomy for ovarian cancer prophylaxis but had to be counseled a second time and  will have bipolar cautery fulguration      Patient Active Problem List   Diagnosis Date Noted  . 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 BV (bacterial vaginosis) 03/26/2016  . ASCUS with positive high risk HPV cervical 03/13/2016    Plan: Laparoscopic bilateral sterilization using electrocautery 12/19/2016  Lazaro Arms, MD 12/19/2016 7:57 AM

## 2016-12-19 NOTE — Discharge Instructions (Signed)
Laparoscopic Tubal Ligation, Care After °Refer to this sheet in the next few weeks. These instructions provide you with information about caring for yourself after your procedure. Your health care provider may also give you more specific instructions. Your treatment has been planned according to current medical practices, but problems sometimes occur. Call your health care provider if you have any problems or questions after your procedure. °What can I expect after the procedure? °After the procedure, it is common to have: °· A sore throat. °· Discomfort in your shoulder. °· Mild discomfort or cramping in your abdomen. °· Gas pains. °· Pain or soreness in the area where the surgical cut (incision) was made. °· A bloated feeling. °· Tiredness. °· Nausea. °· Vomiting. °Follow these instructions at home: °Medicines °· Take over-the-counter and prescription medicines only as told by your health care provider. °· Do not take aspirin because it can cause bleeding. °· Do not drive or operate heavy machinery while taking prescription pain medicine. °Activity °· Rest for the rest of the day. °· Return to your normal activities as told by your health care provider. Ask your health care provider what activities are safe for you. °Incision care °· Follow instructions from your health care provider about how to take care of your incision. Make sure you: °¨ Wash your hands with soap and water before you change your bandage (dressing). If soap and water are not available, use hand sanitizer. °¨ Change your dressing as told by your health care provider. °¨ Leave stitches (sutures) in place. They may need to stay in place for 2 weeks or longer. °· Check your incision area every day for signs of infection. Check for: °¨ More redness, swelling, or pain. °¨ More fluid or blood. °¨ Warmth. °¨ Pus or a bad smell. °Other Instructions °· Do not take baths, swim, or use a hot tub until your health care provider approves. You may take  showers. °· Keep all follow-up visits as told by your health care provider. This is important. °· Have someone help you with your daily household tasks for the first few days. °Contact a health care provider if: °· You have more redness, swelling, or pain around your incision. °· Your incision feels warm to the touch. °· You have pus or a bad smell coming from your incision. °· The edges of your incision break open after the sutures have been removed. °· Your pain does not improve after 2-3 days. °· You have a rash. °· You repeatedly become dizzy or light-headed. °· Your pain medicine is not helping. °· You are constipated. °Get help right away if: °· You have a fever. °· You faint. °· You have increasing pain in your abdomen. °· You have severe pain in one or both of your shoulders. °· You have fluid or blood coming from your sutures or from your vagina. °· You have shortness of breath or difficulty breathing. °· You have chest pain or leg pain. °· You have ongoing nausea, vomiting, or diarrhea. °This information is not intended to replace advice given to you by your health care provider. Make sure you discuss any questions you have with your health care provider. °Document Released: 04/27/2005 Document Revised: 03/12/2016 Document Reviewed: 09/18/2015 °Elsevier Interactive Patient Education © 2017 [employer] ° °

## 2016-12-20 ENCOUNTER — Encounter (HOSPITAL_COMMUNITY): Payer: Self-pay | Admitting: Obstetrics & Gynecology

## 2017-01-01 ENCOUNTER — Encounter: Payer: Medicaid Other | Admitting: Obstetrics & Gynecology

## 2017-01-01 ENCOUNTER — Ambulatory Visit (INDEPENDENT_AMBULATORY_CARE_PROVIDER_SITE_OTHER): Payer: Medicaid Other | Admitting: Obstetrics & Gynecology

## 2017-01-01 ENCOUNTER — Encounter: Payer: Self-pay | Admitting: Obstetrics & Gynecology

## 2017-01-01 VITALS — BP 100/70 | HR 72 | Wt 181.0 lb

## 2017-01-01 DIAGNOSIS — Z9851 Tubal ligation status: Secondary | ICD-10-CM

## 2017-01-01 DIAGNOSIS — Z9889 Other specified postprocedural states: Secondary | ICD-10-CM

## 2017-01-01 NOTE — Progress Notes (Signed)
  HPI: Patient returns for routine postoperative follow-up having undergone Laparoscopic bilateral tubal ligation using electrocautery on 12/19/2016.  The patient's immediate postoperative recovery has been unremarkable. Since hospital discharge the patient reports no problems.   Current Outpatient Prescriptions: Multiple Vitamin (MULTIVITAMIN) tablet, Take 1 tablet by mouth daily., Disp: , Rfl:   No current facility-administered medications for this visit.     Blood pressure 100/70, pulse 72, weight 181 lb (82.1 kg), last menstrual period 12/07/2016, currently breastfeeding.  Physical Exam: Incision clean dry intact abdoman soft non tender  Diagnostic Tests:   Pathology:   Impression: S/p tubal ligation using electrocautery  Plan:   Follow up: 6  months yearly  Lazaro ArmsEURE,LUTHER H, MD

## 2017-03-19 ENCOUNTER — Ambulatory Visit: Payer: Medicaid Other | Admitting: Advanced Practice Midwife

## 2017-03-20 ENCOUNTER — Encounter: Payer: Self-pay | Admitting: Advanced Practice Midwife

## 2017-03-20 ENCOUNTER — Ambulatory Visit (INDEPENDENT_AMBULATORY_CARE_PROVIDER_SITE_OTHER): Payer: Medicaid Other | Admitting: Advanced Practice Midwife

## 2017-03-20 VITALS — BP 120/80 | HR 78 | Wt 189.0 lb

## 2017-03-20 DIAGNOSIS — N949 Unspecified condition associated with female genital organs and menstrual cycle: Secondary | ICD-10-CM

## 2017-03-20 NOTE — Patient Instructions (Signed)
Sydney Alexander (or store brand equivalent) vaginal cream every other day for a week

## 2017-03-20 NOTE — Progress Notes (Signed)
[facility] Visit  Patient name: Sydney Alexander MRN [id number]  Date of birth: 03/02/1986  CC & HPI:  Sydney Alexander is a 31 y.o. African American female presenting today for some vaginal discharge and discomfort.  She was in [place] last week (swimming, also used different soap) and when she got home she noticed her discharge was increased.  Looked clear to milky white, no odor, no itch.  Had some times when she felt "a little uncomfortable", primarily externally.  Hoping her husband didn't "give her anything".  Feels fine today.  Started period yesterday.   Pertinent History Reviewed:  Medical & Surgical Hx:   Past Medical History:  Diagnosis Date  . Abnormal pap   . BV (bacterial vaginosis)   . Contraceptive management 03/10/2015  . Cramping affecting pregnancy, antepartum 03/26/2016  . Encounter for IUD removal 03/10/2015  . Vaginal discharge 03/26/2016  . Vaginal irritation 03/26/2016  . Vaginal Pap smear, abnormal    Past Surgical History:  Procedure Laterality Date  . LAPAROSCOPIC TUBAL LIGATION Bilateral 12/19/2016   Procedure: LAPAROSCOPIC BILATERAL TUBAL LIGATION USING ELECTROCAUTERY;  Surgeon: Lazaro ArmsLuther H Eure, MD;  Location: [facility];  Service: Gynecology;  Laterality: Bilateral;  . NO PAST SURGERIES     Family History  Problem Relation Age of Onset  . Cancer Maternal Grandmother   . Thyroid disease Mother   . Cancer Paternal Aunt   . Diabetes Other        lots of aunts and uncles on both sides    Current Outpatient Prescriptions:  Marland Kitchen.  Multiple Vitamin (MULTIVITAMIN) tablet, Take 1 tablet by mouth daily., Disp: , Rfl:  Social History: Reviewed -  reports that she has never smoked. She has never used smokeless tobacco.  Review of Systems:   Constitutional: Negative for fever and chills Eyes: Negative for visual disturbances Respiratory: Negative for shortness of breath, dyspnea Cardiovascular: Negative for chest pain or palpitations  Gastrointestinal: Negative for  vomiting, diarrhea and constipation; no abdominal pain Genitourinary: Negative for dysuria and urgency, vaginal irritation or itching Musculoskeletal: Negative for back pain, joint pain, myalgias  Neurological: Negative for dizziness and headaches    Objective Findings:    Physical Examination: General appearance - well appearing, and in no distress Mental status - alert, oriented to person, place, and time Chest:  Normal respiratory effort Heart - normal rate and regular rhythm Abdomen:  Soft, nontender Pelvic: SSE:  Only menstrual blood visible. Wet prep neg. Vulva/vagina look normal  Musculoskeletal:  Normal range of motion without pain Extremities:  No edema    No results found for this or any previous visit (from the past 24 hour(s)).    Assessment & Plan:  A:   Normal appearing vagina/discharge P:  If sx return, may use Luvena a few days   Return for If you have any problems.  CRESENZO-DISHMAN,Avanthika Dehnert CNM 03/20/2017 2:16 PM

## 2017-03-22 LAB — GC/CHLAMYDIA PROBE AMP
CHLAMYDIA, DNA PROBE: NEGATIVE
Neisseria gonorrhoeae by PCR: NEGATIVE

## 2017-07-08 ENCOUNTER — Other Ambulatory Visit (HOSPITAL_COMMUNITY)
Admission: RE | Admit: 2017-07-08 | Discharge: 2017-07-08 | Disposition: A | Discharge status: Home / Self Care | Payer: Medicaid Other | Source: Ambulatory Visit | Attending: Obstetrics & Gynecology | Admitting: Obstetrics & Gynecology

## 2017-07-08 ENCOUNTER — Encounter: Payer: Self-pay | Admitting: Women's Health

## 2017-07-08 ENCOUNTER — Ambulatory Visit (INDEPENDENT_AMBULATORY_CARE_PROVIDER_SITE_OTHER): Payer: Medicaid Other | Admitting: Women's Health

## 2017-07-08 VITALS — BP 116/62 | HR 94 | Ht 62.5 in | Wt 191.0 lb

## 2017-07-08 DIAGNOSIS — Z01419 Encounter for gynecological examination (general) (routine) without abnormal findings: Secondary | ICD-10-CM

## 2017-07-08 DIAGNOSIS — Z01411 Encounter for gynecological examination (general) (routine) with abnormal findings: Secondary | ICD-10-CM

## 2017-07-08 DIAGNOSIS — B9689 Other specified bacterial agents as the cause of diseases classified elsewhere: Secondary | ICD-10-CM | POA: Diagnosis not present

## 2017-07-08 DIAGNOSIS — Z124 Encounter for screening for malignant neoplasm of cervix: Secondary | ICD-10-CM | POA: Diagnosis not present

## 2017-07-08 DIAGNOSIS — N898 Other specified noninflammatory disorders of vagina: Secondary | ICD-10-CM | POA: Diagnosis not present

## 2017-07-08 DIAGNOSIS — Z0001 Encounter for general adult medical examination with abnormal findings: Secondary | ICD-10-CM | POA: Diagnosis not present

## 2017-07-08 DIAGNOSIS — N760 Acute vaginitis: Secondary | ICD-10-CM | POA: Diagnosis not present

## 2017-07-08 LAB — POCT WET PREP (WET MOUNT)
CLUE CELLS WET PREP WHIFF POC: POSITIVE
TRICHOMONAS WET PREP HPF POC: ABSENT

## 2017-07-08 MED ORDER — METRONIDAZOLE 0.75 % VA GEL: 1.0000 | Freq: Every day | VAGINAL | 0 refills | Status: DC

## 2017-07-08 NOTE — Progress Notes (Signed)
Subjective:   Sydney Alexander is a 31 y.o. G53P3003 African American female here for a routine well-woman exam.  Patient's last menstrual period was 07/07/2017 (exact date).    Current complaints: some vaginal irritation, d/c, slight odor.  Some leakage of urine when coughing, sneezing, laughing- hasn't tried anything.  H/o postpartum depression w/ occ suicidal thoughts- was referred to [facility] for counseling in Jan. States she is much better, depression/suicidal thoughts have completely resolved.        Does not desire labs, only gc/ct   Social History: Sexual: heterosexual Tobacco/alcohol: no tobacco, occ etoh Illicit drugs: no history of illicit drug use  The following portions of the patient's history were reviewed and updated as appropriate: allergies, current medications, past family history, past medical history, past social history, past surgical history and problem list.  Past Medical History Past Medical History:  Diagnosis Date  . Abnormal pap   . BV (bacterial vaginosis)   . Contraceptive management 03/10/2015  . Cramping affecting pregnancy, antepartum 03/26/2016  . Encounter for IUD removal 03/10/2015  . Vaginal discharge 03/26/2016  . Vaginal irritation 03/26/2016  . Vaginal Pap smear, abnormal     Past Surgical History Past Surgical History:  Procedure Laterality Date  . LAPAROSCOPIC TUBAL LIGATION Bilateral 12/19/2016   Procedure: LAPAROSCOPIC BILATERAL TUBAL LIGATION USING ELECTROCAUTERY;  Surgeon: Lazaro Arms, MD;  Location: [facility];  Service: Gynecology;  Laterality: Bilateral;  . NO PAST SURGERIES      Gynecologic History G3P3003  Patient's last menstrual period was 07/07/2017 (exact date). Contraception: tubal ligation Last Pap: 03/05/16. Results were: ASCUS w/ +HRHPV, colpo during pregnancy visible lesion, did not have repeat colpo pp Last mammogram: never. Results were: n/a Last TCS: never  Obstetric History OB History  Gravida Para Term Preterm AB Living   SAB TAB Ectopic Multiple Live Births        0 3    # Outcome Date GA Lbr Len/2nd Weight Sex Delivery Anes PTL Lv  3 Term 09/25/16 102w3d 07:57 / 00:24 7 lb 4.2 oz (3.295 kg) M Vag-Spont EPI, Local  LIV  2 Term 11/10/08 [redacted]w[redacted]d  8 lb (3.629 kg) F Vag-Spont   LIV  1 Term 10/11/06 [redacted]w[redacted]d  7 lb 6 oz (3.345 kg) F Vag-Spont   LIV      Current Medications Current Outpatient Prescriptions on File Prior to Visit  Medication Sig Dispense Refill  . Multiple Vitamin (MULTIVITAMIN) tablet Take 1 tablet by mouth daily.     No current facility-administered medications on file prior to visit.     Review of Systems Patient denies any headaches, blurred vision, shortness of breath, chest pain, abdominal pain, problems with bowel movements or intercourse.  Objective:  BP 116/62 (BP Location: Right Arm, Patient Position: Sitting, Cuff Size: Normal)   Pulse 94   Ht 5' 2.5" (1.588 m)   Wt 191 lb (86.6 kg)   LMP 07/07/2017 (Exact Date)   Breastfeeding? No   BMI 34.38 kg/m  Physical Exam  General:  Well developed, well nourished, no acute distress. She is alert and oriented x3. Skin:  Warm and dry Neck:  Midline trachea, no thyromegaly or nodules Cardiovascular: Regular rate and rhythm, no murmur heard Lungs:  Effort normal, all lung fields clear to auscultation bilaterally Breasts:  No dominant palpable mass, retraction, or nipple discharge Abdomen:  Soft, non tender, no hepatosplenomegaly or masses Pelvic:  External genitalia is normal in appearance.  The vagina is normal in appearance. The cervix is bulbous, no CMT.  Thin prep pap is done w/ HR HPV cotesting. Uterus is felt to be normal size, shape, and contour.  No adnexal masses or tenderness noted. Small amt thin white slightly malodorous d/c Extremities:  No swelling or varicosities noted Psych:  She has a normal mood and affect  Results for orders placed or performed in visit on 07/08/17 (from the past 24 hour(s))  POCT Wet  Prep Mellody Drown [place])     Status: Abnormal   Collection Time: 07/08/17  2:47 PM  Result Value Ref Range   Source Wet Prep POC vaginal    WBC, Wet Prep HPF POC none    Bacteria Wet Prep HPF POC None (A) Few   BACTERIA WET PREP MORPHOLOGY POC     Clue Cells Wet Prep HPF POC Few (A) None   Clue Cells Wet Prep Whiff POC Positive Whiff    Yeast Wet Prep HPF POC None    KOH Wet Prep POC     Trichomonas Wet Prep HPF POC Absent Absent     Assessment:   Healthy well-woman exam H/O abnormal pap BV Stress urinary incontinence Resolved postpartum depression/occ suicidal thoughts  Plan:  Rx metrogel qhs x 5nights for BV per pt preference, no sex or etoh while using F/U 66yr for physical, or sooner if needed Recommended and discussed Kegels for SUI Mammogram  or sooner if problems Colonoscopy  or sooner if problems  Marge Duncans CNM, [facility] 07/08/2017 2:47 PM

## 2017-07-08 NOTE — Patient Instructions (Signed)
Bacterial Vaginosis Bacterial vaginosis is a vaginal infection that occurs when the normal balance of bacteria in the vagina is disrupted. It results from an overgrowth of certain bacteria. This is the most common vaginal infection among women ages 15-44. Because bacterial vaginosis increases your risk for STIs (sexually transmitted infections), getting treated can help reduce your risk for chlamydia, gonorrhea, herpes, and HIV (human immunodeficiency virus). Treatment is also important for preventing complications in pregnant women, because this condition can cause an early (premature) delivery. What are the causes? This condition is caused by an increase in harmful bacteria that are normally present in small amounts in the vagina. However, the reason that the condition develops is not fully understood. What increases the risk? The following factors may make you more likely to develop this condition:  Having a new sexual partner or multiple sexual partners.  Having unprotected sex.  Douching.  Having an intrauterine device (IUD).  Smoking.  Drug and alcohol abuse.  Taking certain antibiotic medicines.  Being pregnant.  You cannot get bacterial vaginosis from toilet seats, bedding, swimming pools, or contact with objects around you. What are the signs or symptoms? Symptoms of this condition include:  Grey or white vaginal discharge. The discharge can also be watery or foamy.  A fish-like odor with discharge, especially after sexual intercourse or during menstruation.  Itching in and around the vagina.  Burning or pain with urination.  Some women with bacterial vaginosis have no signs or symptoms. How is this diagnosed? This condition is diagnosed based on:  Your medical history.  A physical exam of the vagina.  Testing a sample of vaginal fluid under a microscope to look for a large amount of bad bacteria or abnormal cells. Your health care provider may use a cotton swab  or a small wooden spatula to collect the sample.  How is this treated? This condition is treated with antibiotics. These may be given as a pill, a vaginal cream, or a medicine that is put into the vagina (suppository). If the condition comes back after treatment, a second round of antibiotics may be needed. Follow these instructions at home: Medicines  Take over-the-counter and prescription medicines only as told by your health care provider.  Take or use your antibiotic as told by your health care provider. Do not stop taking or using the antibiotic even if you start to feel better. General instructions  If you have a female sexual partner, tell her that you have a vaginal infection. She should see her health care provider and be treated if she has symptoms. If you have a female sexual partner, he does not need treatment.  During treatment: ? Avoid sexual activity until you finish treatment. ? Do not douche. ? Avoid alcohol as directed by your health care provider. ? Avoid breastfeeding as directed by your health care provider.  Drink enough water and fluids to keep your urine clear or pale yellow.  Keep the area around your vagina and rectum clean. ? Wash the area daily with warm water. ? Wipe yourself from front to back after using the toilet.  Keep all follow-up visits as told by your health care provider. This is important. How is this prevented?  Do not douche.  Wash the outside of your vagina with warm water only.  Use protection when having sex. This includes latex condoms and dental dams.  Limit how many sexual partners you have. To help prevent bacterial vaginosis, it is best to have sex with just   one partner (monogamous).  Make sure you and your sexual partner are tested for STIs.  Wear cotton or cotton-lined underwear.  Avoid wearing tight pants and pantyhose, especially during summer.  Limit the amount of alcohol that you drink.  Do not use any products that  contain nicotine or tobacco, such as cigarettes and e-cigarettes. If you need help quitting, ask your health care provider.  Do not use illegal drugs. Where to find more information:  Centers for Disease Control and Prevention: www.cdc.gov/std  American Sexual Health Association (ASHA): www.ashastd.org  U.S. Department of Health and Human Services, Office on Women's Health: www.womenshealth.gov/ or https://www.womenshealth.gov/a-z-topics/bacterial-vaginosis Contact a health care provider if:  Your symptoms do not improve, even after treatment.  You have more discharge or pain when urinating.  You have a fever.  You have pain in your abdomen.  You have pain during sex.  You have vaginal bleeding between periods. Summary  Bacterial vaginosis is a vaginal infection that occurs when the normal balance of bacteria in the vagina is disrupted.  Because bacterial vaginosis increases your risk for STIs (sexually transmitted infections), getting treated can help reduce your risk for chlamydia, gonorrhea, herpes, and HIV (human immunodeficiency virus). Treatment is also important for preventing complications in pregnant women, because the condition can cause an early (premature) delivery.  This condition is treated with antibiotic medicines. These may be given as a pill, a vaginal cream, or a medicine that is put into the vagina (suppository). This information is not intended to replace advice given to you by your health care provider. Make sure you discuss any questions you have with your health care provider. Document Released: 10/08/2005 Document Revised: 06/23/2016 Document Reviewed: 06/23/2016 Elsevier Interactive Patient Education  2017 [employer]vier Inc.  

## 2017-07-10 LAB — CYTOLOGY - PAP
Adequacy: ABSENT
CHLAMYDIA, DNA PROBE: NEGATIVE
Diagnosis: NEGATIVE
HPV (WINDOPATH): NOT DETECTED
Neisseria Gonorrhea: NEGATIVE

## 2017-12-25 ENCOUNTER — Telehealth: Payer: Self-pay | Admitting: Women's Health

## 2017-12-25 NOTE — Telephone Encounter (Signed)
Pt called requesting a refill on the metrogel that she had been prescribed in September. She states that she is having the same symptoms, discharge and irritation. Informed pt that she would need to make an appointment to evaluate the discharge. Pt verbalized understanding.

## 2017-12-25 NOTE — Telephone Encounter (Signed)
Patient called stating that she would like a refill of a gel Kim use to prescribe to her. Please contact pt

## 2018-01-02 ENCOUNTER — Ambulatory Visit: Payer: Medicaid Other | Admitting: Adult Health

## 2018-01-21 ENCOUNTER — Ambulatory Visit: Payer: Medicaid Other | Admitting: Adult Health

## 2018-01-30 ENCOUNTER — Ambulatory Visit: Payer: Medicaid Other | Admitting: Adult Health

## 2018-03-10 ENCOUNTER — Ambulatory Visit: Payer: Medicaid Other | Admitting: Women's Health

## 2018-03-13 ENCOUNTER — Ambulatory Visit: Payer: Medicaid Other | Admitting: Adult Health

## 2018-03-13 ENCOUNTER — Encounter: Payer: Self-pay | Admitting: Adult Health

## 2018-03-13 VITALS — BP 100/62 | HR 76 | Ht 62.0 in | Wt 188.0 lb

## 2018-03-13 DIAGNOSIS — N898 Other specified noninflammatory disorders of vagina: Secondary | ICD-10-CM | POA: Insufficient documentation

## 2018-03-13 DIAGNOSIS — B9689 Other specified bacterial agents as the cause of diseases classified elsewhere: Secondary | ICD-10-CM | POA: Diagnosis not present

## 2018-03-13 DIAGNOSIS — N760 Acute vaginitis: Secondary | ICD-10-CM | POA: Diagnosis not present

## 2018-03-13 LAB — POCT WET PREP (WET MOUNT)
Clue Cells Wet Prep Whiff POC: POSITIVE
WBC WET PREP: POSITIVE

## 2018-03-13 MED ORDER — METRONIDAZOLE 500 MG PO TABS
500.0000 mg | ORAL_TABLET | Freq: Two times a day (BID) | ORAL | 1 refills | Status: DC
Start: 2018-03-13 — End: 2018-05-19

## 2018-03-13 NOTE — Patient Instructions (Signed)
Bacterial Vaginosis Bacterial vaginosis is a vaginal infection that occurs when the normal balance of bacteria in the vagina is disrupted. It results from an overgrowth of certain bacteria. This is the most common vaginal infection among women ages 15-44. Because bacterial vaginosis increases your risk for STIs (sexually transmitted infections), getting treated can help reduce your risk for chlamydia, gonorrhea, herpes, and HIV (human immunodeficiency virus). Treatment is also important for preventing complications in pregnant women, because this condition can cause an early (premature) delivery. What are the causes? This condition is caused by an increase in harmful bacteria that are normally present in small amounts in the vagina. However, the reason that the condition develops is not fully understood. What increases the risk? The following factors may make you more likely to develop this condition:  Having a new sexual partner or multiple sexual partners.  Having unprotected sex.  Douching.  Having an intrauterine device (IUD).  Smoking.  Drug and alcohol abuse.  Taking certain antibiotic medicines.  Being pregnant.  You cannot get bacterial vaginosis from toilet seats, bedding, swimming pools, or contact with objects around you. What are the signs or symptoms? Symptoms of this condition include:  Grey or white vaginal discharge. The discharge can also be watery or foamy.  A fish-like odor with discharge, especially after sexual intercourse or during menstruation.  Itching in and around the vagina.  Burning or pain with urination.  Some women with bacterial vaginosis have no signs or symptoms. How is this diagnosed? This condition is diagnosed based on:  Your medical history.  A physical exam of the vagina.  Testing a sample of vaginal fluid under a microscope to look for a large amount of bad bacteria or abnormal cells. Your health care provider may use a cotton swab  or a small wooden spatula to collect the sample.  How is this treated? This condition is treated with antibiotics. These may be given as a pill, a vaginal cream, or a medicine that is put into the vagina (suppository). If the condition comes back after treatment, a second round of antibiotics may be needed. Follow these instructions at home: Medicines  Take over-the-counter and prescription medicines only as told by your health care provider.  Take or use your antibiotic as told by your health care provider. Do not stop taking or using the antibiotic even if you start to feel better. General instructions  If you have a female sexual partner, tell her that you have a vaginal infection. She should see her health care provider and be treated if she has symptoms. If you have a female sexual partner, he does not need treatment.  During treatment: ? Avoid sexual activity until you finish treatment. ? Do not douche. ? Avoid alcohol as directed by your health care provider. ? Avoid breastfeeding as directed by your health care provider.  Drink enough water and fluids to keep your urine clear or pale yellow.  Keep the area around your vagina and rectum clean. ? Wash the area daily with warm water. ? Wipe yourself from front to back after using the toilet.  Keep all follow-up visits as told by your health care provider. This is important. How is this prevented?  Do not douche.  Wash the outside of your vagina with warm water only.  Use protection when having sex. This includes latex condoms and dental dams.  Limit how many sexual partners you have. To help prevent bacterial vaginosis, it is best to have sex with just   one partner (monogamous).  Make sure you and your sexual partner are tested for STIs.  Wear cotton or cotton-lined underwear.  Avoid wearing tight pants and pantyhose, especially during summer.  Limit the amount of alcohol that you drink.  Do not use any products that  contain nicotine or tobacco, such as cigarettes and e-cigarettes. If you need help quitting, ask your health care provider.  Do not use illegal drugs. Where to find more information:  Centers for Disease Control and Prevention: www.cdc.gov/std  American Sexual Health Association (ASHA): www.ashastd.org  U.S. Department of Health and Human Services, Office on Women's Health: www.womenshealth.gov/ or https://www.womenshealth.gov/a-z-topics/bacterial-vaginosis Contact a health care provider if:  Your symptoms do not improve, even after treatment.  You have more discharge or pain when urinating.  You have a fever.  You have pain in your abdomen.  You have pain during sex.  You have vaginal bleeding between periods. Summary  Bacterial vaginosis is a vaginal infection that occurs when the normal balance of bacteria in the vagina is disrupted.  Because bacterial vaginosis increases your risk for STIs (sexually transmitted infections), getting treated can help reduce your risk for chlamydia, gonorrhea, herpes, and HIV (human immunodeficiency virus). Treatment is also important for preventing complications in pregnant women, because the condition can cause an early (premature) delivery.  This condition is treated with antibiotic medicines. These may be given as a pill, a vaginal cream, or a medicine that is put into the vagina (suppository). This information is not intended to replace advice given to you by your health care provider. Make sure you discuss any questions you have with your health care provider. Document Released: 10/08/2005 Document Revised: 02/11/2017 Document Reviewed: 06/23/2016 Elsevier Interactive Patient Education  2018 Elsevier Inc.  

## 2018-03-13 NOTE — Progress Notes (Signed)
  Subjective:     Patient ID: Sydney Alexander, female   DOB: [birth year], 32 y.o.   MRN: [id number]  HPI Sydney Alexander is a 32 year old black female in complaining of vaginal discharge with slight odor, no itching or burning, and no new partners. Period is near.   Review of Systems +vaginal discharge with slight odor Denies itching or burning, no new sex partners  Reviewed past medical,surgical, social and family history. Reviewed medications and allergies.     Objective:   Physical Exam BP 100/62 (BP Location: Left Arm, Patient Position: Sitting, Cuff Size: Normal)   Pulse 76   Ht  (1.575 m)   Wt 188 lb (85.3 kg)   LMP 02/11/2018 (Approximate)   Breastfeeding? No   BMI 34.39 kg/m  Skin warm and dry.Pelvic: external genitalia is normal in appearance no lesions, vagina:pinkish discharge with skight odor,urethra has no lesions or masses noted, cervix:smooth and bulbous, uterus: normal size, shape and contour, non tender, no masses felt, adnexa: no masses or tenderness noted. Bladder is non tender and no masses felt. Wet prep: + for clue cells and +WBCs.   Declines STD testing.   Assessment:     1. BV (bacterial vaginosis)   2. Vaginal discharge   3. Vaginal odor       Plan:     Meds ordered this encounter  Medications  . metroNIDAZOLE (FLAGYL) 500 MG tablet    Sig: Take 1 tablet (500 mg total) by mouth 2 (two) times daily.    Dispense:  14 tablet    Refill:  1    Order Specific Question:   Supervising Provider    Answer:   Duane Lope H [[id number]]  No sex or alcohol during treatment Review handout on BV F/U prn

## 2018-03-31 ENCOUNTER — Telehealth: Payer: Self-pay | Admitting: *Deleted

## 2018-03-31 NOTE — Telephone Encounter (Signed)
Pt reports that since last Wednesday she started developing itchy places on her chest that has spread to stomach back legs and face. She denies any swelling or difficulty breathing. She relates it to this recent antibiotics use. Advised patient to use benadryl and hydrocortisone cream directly to the [facility] N[facility]. Advised if no improvement in a couple days or she starts to have trouble with breathing or swelling to contact us or go to emergency room. Patient is agreeable to this and has no further questions or concerns at this time.

## 2018-05-19 ENCOUNTER — Other Ambulatory Visit: Payer: Self-pay | Admitting: Adult Health

## 2018-06-30 ENCOUNTER — Telehealth: Payer: Self-pay | Admitting: *Deleted

## 2018-06-30 MED ORDER — METRONIDAZOLE 500 MG PO TABS: 500.0000 mg | ORAL_TABLET | Freq: Two times a day (BID) | ORAL | 1 refills | Status: DC

## 2018-06-30 MED ORDER — FLUCONAZOLE 150 MG PO TABS: ORAL_TABLET | ORAL | 1 refills | Status: DC

## 2018-06-30 NOTE — Telephone Encounter (Signed)
Patient states she is having discharge, itching and irritation, no odor.  Says she took the first dose and developed a rash so she took Benadryl with it.  States she has recently taken the refill but only took 1 daily and not too and did not experience the rash.  She thinks she could have a yeast infection and tried Monistat but it did not work.  I offered for patient to be seen to be diagnosed with exactly what is going on but states she started her cycle yesterday.  Patient requested the Flagyl and Diflucan.  Please advise.

## 2018-06-30 NOTE — Telephone Encounter (Signed)
Left message, Rx sent for flagyl and diflucan,bu tif not better after period, make appt.

## 2021-01-13 DIAGNOSIS — Z131 Encounter for screening for diabetes mellitus: Secondary | ICD-10-CM | POA: Diagnosis not present

## 2021-01-13 DIAGNOSIS — Z1329 Encounter for screening for other suspected endocrine disorder: Secondary | ICD-10-CM | POA: Diagnosis not present

## 2021-01-13 DIAGNOSIS — N898 Other specified noninflammatory disorders of vagina: Secondary | ICD-10-CM | POA: Diagnosis not present

## 2021-01-13 DIAGNOSIS — N930 Postcoital and contact bleeding: Secondary | ICD-10-CM | POA: Diagnosis not present

## 2021-01-13 DIAGNOSIS — Z13220 Encounter for screening for lipoid disorders: Secondary | ICD-10-CM | POA: Diagnosis not present

## 2021-01-13 DIAGNOSIS — Z0001 Encounter for general adult medical examination with abnormal findings: Secondary | ICD-10-CM | POA: Diagnosis not present

## 2021-01-13 DIAGNOSIS — Z1151 Encounter for screening for human papillomavirus (HPV): Secondary | ICD-10-CM | POA: Diagnosis not present

## 2021-01-13 DIAGNOSIS — Z130 Encounter for screening for diseases of the blood and blood-forming organs and certain disorders involving the immune mechanism: Secondary | ICD-10-CM | POA: Diagnosis not present

## 2021-02-13 ENCOUNTER — Ambulatory Visit (INDEPENDENT_AMBULATORY_CARE_PROVIDER_SITE_OTHER): Payer: BLUE CROSS/BLUE SHIELD | Admitting: Adult Health

## 2021-02-13 ENCOUNTER — Encounter: Payer: Self-pay | Admitting: Adult Health

## 2021-02-13 ENCOUNTER — Other Ambulatory Visit: Payer: Self-pay

## 2021-02-13 VITALS — BP 131/85 | HR 69 | Ht 62.0 in | Wt 198.0 lb

## 2021-02-13 DIAGNOSIS — N930 Postcoital and contact bleeding: Secondary | ICD-10-CM | POA: Diagnosis not present

## 2021-02-13 MED ORDER — METRONIDAZOLE 0.75 % VA GEL: 1.0000 | Freq: Every day | VAGINAL | 0 refills | Status: DC

## 2021-02-13 NOTE — Progress Notes (Signed)
  Subjective:     Patient ID: Sydney Alexander, female   DOB: [birth year], 35 y.o.   MRN: [id number]  HPI Sydney Alexander is a 35 year old black female, single, G3P3 in complaining of bleeding after sex for about a year. She had pap at [facility] 01/13/21 was negative for malignancy and HPV. PCP is [facility].   Review of Systems Has had bleeding after sex for about a year Denies any pain Denies discharge, odor or itching/burning Reviewed past medical,surgical, social and family history. Reviewed medications and allergies.     Objective:   Physical Exam BP 131/85 (BP Location: Left Arm, Patient Position: Sitting, Cuff Size: Normal)   Pulse 69   Ht 5\' 2"  (1.575 m)   Wt 198 lb (89.8 kg)   LMP 02/01/2021 (Approximate)   BMI 36.21 kg/m  Skin warm and dry.Pelvic: external genitalia is normal in appearance no lesions, vagina: creamy discharge without odor,urethra has no lesions or masses noted, cervix:smooth and bulbous,no bleeding with contact, large Q-tip and finger, uterus: normal size, shape and contour, non tender, no masses felt, adnexa: no masses or tenderness noted. Bladder is non tender and no masses felt.  AA is 0 Fall risk is low PHQ 9 score is 0 GAD 7 score is 0  [facility] - 02/13/21 1019      Pregnancy Intention Screening   Does the patient want to become pregnant in the next year? No    Does the patient's partner want to become pregnant in the next year? No    Would the patient like to discuss contraceptive options today? No      Contraception Wrap Up   Current Method Female Sterilization    End Method Female Sterilization    Contraception Counseling Provided No         Examination chaperoned by 02/15/21 LPN    Assessment:     1. Postcoital bleeding Will rx metrogel to use for about 3 days this week and have sex the weekend and let me know if any bleeding, and change in positions, where he does not make contact with cervix. Meds ordered this encounter  Medications  . metroNIDAZOLE (METROGEL  VAGINAL) 0.75 % vaginal gel    Sig: Place 1 Applicatorful vaginally at bedtime.    Dispense:  70 g    Refill:  0    Order Specific Question:   Supervising Provider    Answer:   Marchelle Folks [[id number]]      Plan:     Follow up prn

## 2021-05-28 ENCOUNTER — Other Ambulatory Visit: Payer: Self-pay

## 2021-05-28 ENCOUNTER — Encounter (HOSPITAL_COMMUNITY): Payer: Self-pay | Admitting: *Deleted

## 2021-05-28 ENCOUNTER — Ambulatory Visit (HOSPITAL_COMMUNITY)
Admission: EM | Admit: 2021-05-28 | Discharge: 2021-05-28 | Disposition: A | Discharge status: Home / Self Care | Payer: BLUE CROSS/BLUE SHIELD | Attending: Emergency Medicine | Admitting: Emergency Medicine

## 2021-05-28 DIAGNOSIS — T20411A Corrosion of unspecified degree of right ear [any part, except ear drum], initial encounter: Secondary | ICD-10-CM

## 2021-05-28 DIAGNOSIS — T304 Corrosion of unspecified body region, unspecified degree: Secondary | ICD-10-CM

## 2021-05-28 DIAGNOSIS — T20412A Corrosion of unspecified degree of left ear [any part, except ear drum], initial encounter: Secondary | ICD-10-CM

## 2021-05-28 MED ORDER — BACITRACIN ZINC 500 UNIT/GM EX OINT: 1.0000 "application " | TOPICAL_OINTMENT | Freq: Two times a day (BID) | CUTANEOUS | 0 refills | Status: DC

## 2021-05-28 NOTE — ED Triage Notes (Signed)
Pt now reports she used a new spray last night.

## 2021-05-28 NOTE — ED Provider Notes (Signed)
[facility]    CSN: [id number] Arrival date & time: 05/28/21  1355      History   Chief Complaint Chief Complaint  Patient presents with   Skin Problem    HPI Sydney MOULTON is a 35 y.o. female.   Patient here for evaluation of bilateral ear pain and burning that occurred last night.  Reports putting a hair holding spray on her hair last night and then wrapped her head.  Reports having some mild burning and pain last evening.  Reports waking up this am with worse pain and bilateral ear swelling.  Reports taking benadryl which helped relieved some pain and swelling.  Noticed the blisters to the backs of her ears later.  Denies any fevers, chest pain, shortness of breath, N/V/D, numbness, tingling, weakness, abdominal pain, or headaches.    The history is provided by the patient.   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 Authorizing Provider  bacitracin ointment Apply 1 application topically 2 (two) times daily. 05/28/21  Yes Ivette Loyal, NP  metroNIDAZOLE (METROGEL VAGINAL) 0.75 % vaginal gel Place 1 Applicatorful vaginally at bedtime. 02/13/21   Adline Potter, NP  Multiple Vitamins-Minerals (WOMENS MULTI VITAMIN & MINERAL PO) Take by mouth.    [provider]    Family History Family History  Problem Relation Age of Onset   Cancer Maternal Grandmother    Thyroid disease Mother    Cancer Paternal Aunt    Diabetes Other        lots of aunts and uncles on both sides    Social History Social History   Tobacco Use   Smoking status: Never   Smokeless tobacco: Never  Substance Use Topics   Alcohol use: No   Drug use: No     Allergies   Patient has no known allergies.   Review of Systems Review of Systems  Skin:  Positive for color change and wound.  All other systems reviewed and are negative.   Physical Exam Triage Vital Signs ED Triage Vitals  Enc Vitals Group  BP 05/28/21 1518 113/62     Pulse Rate 05/28/21 1518 67     Resp 05/28/21 1518 16     Temp 05/28/21 1518 98.8 F (37.1 C)     Temp src --      SpO2 05/28/21 1518 100 %     Weight --      Height --      Head Circumference --      Peak Flow --      Pain Score 05/28/21 1520 0     Pain Loc --      Pain Edu? --      Excl. in GC? --    No data found.  Updated Vital Signs BP 120/82   Pulse 67   Temp 98.8 F (37.1 C)   Resp 16   LMP 05/26/2021   SpO2 100%   Visual Acuity Right Eye Distance:   Left Eye Distance:   Bilateral Distance:    Right Eye Near:   Left Eye Near:    Bilateral Near:     Physical Exam Vitals and nursing note reviewed.  Constitutional:      General: She is not in acute distress.    Appearance: Normal appearance. She is not ill-appearing, toxic-appearing or diaphoretic.  HENT:     Head: Normocephalic and  atraumatic.  Eyes:     Conjunctiva/sclera: Conjunctivae normal.  Cardiovascular:     Rate and Rhythm: Normal rate.     Pulses: Normal pulses.  Pulmonary:     Effort: Pulmonary effort is normal.  Abdominal:     General: Abdomen is flat.  Musculoskeletal:        General: Normal range of motion.     Cervical back: Normal range of motion.  Skin:    General: Skin is warm and dry.     Findings: Burn (back of bilateral ears with erythema, redness and blisters) and lesion present.  Neurological:     General: No focal deficit present.     Mental Status: She is alert and oriented to person, place, and time.  Psychiatric:        Mood and Affect: Mood normal.        UC Treatments / Results  Labs (all labs ordered are listed, but only abnormal results are displayed) Labs Reviewed - No data to display  EKG   Radiology No results found.  Procedures Procedures (including critical care time)  Medications Ordered in UC Medications - No data to display  Initial Impression / Assessment and Plan / UC Course  I have reviewed the triage vital signs and the nursing notes.  Pertinent labs & imaging results that were available during my care of the patient were reviewed by me and considered in my medical decision making (see chart for details).    Assessment negative for red flags or concerns.  Chemical burn likely from hair product.  Recommend not using product again.  Will treat with bacitracin twice a day.  May apply cool compresses for comfort.  Tylenol and/or Ibuprofen as needed for pain.  Follow up in a few days for re-evaluation.  Final Clinical Impressions(s) / UC Diagnoses   Final diagnoses:  Chemical burn     Discharge Instructions      Apply the bacitracin twice a day.   You can apply cool compresses for compresses for comfort.   Follow up in a few days for re-evaluation and to make sure that they are improving and healing.  ED Prescriptions     Medication  Sig Dispense Auth. Provider   bacitracin ointment Apply 1 application topically 2 (two) times daily. 120 g Ivette Loyal, NP      PDMP not reviewed this encounter.   Ivette Loyal, NP 05/28/21 [phone]

## 2021-05-28 NOTE — Discharge Instructions (Addendum)
Apply the bacitracin twice a day.   You can apply cool compresses for compresses for comfort.   Follow up in a few days for re-evaluation and to make sure that they are improving and healing.

## 2021-05-28 NOTE — ED Triage Notes (Signed)
PT reports using hair dye one week ago. The skin problem started last night. Located behind ears.

## 2021-08-01 ENCOUNTER — Emergency Department (HOSPITAL_COMMUNITY)
Admission: EM | Admit: 2021-08-01 | Discharge: 2021-08-01 | Disposition: A | Discharge status: Home / Self Care | Payer: Medicaid Other | Attending: Emergency Medicine | Admitting: Emergency Medicine

## 2021-08-01 ENCOUNTER — Encounter (HOSPITAL_COMMUNITY): Payer: Self-pay | Admitting: *Deleted

## 2021-08-01 ENCOUNTER — Other Ambulatory Visit: Payer: Self-pay

## 2021-08-01 DIAGNOSIS — N939 Abnormal uterine and vaginal bleeding, unspecified: Secondary | ICD-10-CM | POA: Insufficient documentation

## 2021-08-01 DIAGNOSIS — A599 Trichomoniasis, unspecified: Secondary | ICD-10-CM | POA: Insufficient documentation

## 2021-08-01 LAB — URINALYSIS, ROUTINE W REFLEX MICROSCOPIC
Bilirubin Urine: NEGATIVE
Glucose, UA: NEGATIVE mg/dL
Hgb urine dipstick: NEGATIVE
Ketones, ur: NEGATIVE mg/dL
Nitrite: NEGATIVE
Protein, ur: NEGATIVE mg/dL
Specific Gravity, Urine: 1.017 (ref 1.005–1.030)
pH: 7 (ref 5.0–8.0)

## 2021-08-01 LAB — POC URINE PREG, ED: Preg Test, Ur: NEGATIVE

## 2021-08-01 LAB — WET PREP, GENITAL
Clue Cells Wet Prep HPF POC: NONE SEEN
Sperm: NONE SEEN
Yeast Wet Prep HPF POC: NONE SEEN

## 2021-08-01 MED ORDER — METRONIDAZOLE 500 MG PO TABS: 500.0000 mg | ORAL_TABLET | Freq: Two times a day (BID) | ORAL | 0 refills | Status: DC

## 2021-08-01 NOTE — ED Triage Notes (Signed)
Vaginal bleeding with left side pain x 3 days

## 2021-08-01 NOTE — Discharge Instructions (Addendum)
You do have an infection called trichomonas which is treated with metronidazole.  Take the entire course of this antibiotic.  Do not drink alcohol while taking this medication as this will cause significant stomach upset and vomiting.  This is an STD and your partner will need to be treated as well prior to resuming intercourse.  I recommend follow-up care for you with your provider at [facility] if you have persistent symptoms.  Your history and exam does not suggest gonorrhea or chlamydia, however these tests are also currently pending.  If either test is positive you will be notified and antibiotics prescribed at that time.

## 2021-08-01 NOTE — ED Provider Notes (Signed)
[facility] EMERGENCY DEPARTMENT Provider Note   CSN: [id number] Arrival date & time: 08/01/21  1333     History Chief Complaint  Patient presents with   Vaginal Bleeding    Sydney Alexander is a 35 y.o. female with a history as outlined below presenting for evaluation of vaginal bleeding and had mild vaginal discharge.  She states she had a normal menstrual cycle 2 weeks ago and then 3 days ago she started having vaginal spotting which is abnormal for her.  She also has noticed a slight increased watery discharge prior to the onset of the vaginal spotting.  She denies abdominal or pelvic pain, no nausea or vomiting, no fevers or chills.  She is sexually active with 1 partner who is having no symptoms.   The history is provided by the patient.      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   Vaginal discharge 03/26/2016   BV (bacterial vaginosis) 03/26/2016   ASCUS with positive high risk HPV cervical 03/13/2016    Past Surgical History:  Procedure Laterality Date   LAPAROSCOPIC TUBAL LIGATION Bilateral 12/19/2016   Procedure: LAPAROSCOPIC BILATERAL TUBAL LIGATION USING ELECTROCAUTERY;  Surgeon: Lazaro Arms, MD;  Location: [facility];  Service: Gynecology;  Laterality: Bilateral;   NO PAST SURGERIES       OB History     Gravida  3   Para  3   Term  3   Preterm      AB      Living  3      SAB      IAB      Ectopic      Multiple  0   Live Births  3           Family History  Problem Relation Age of Onset   Cancer Maternal Grandmother    Thyroid  disease Mother    Cancer Paternal Aunt    Diabetes Other        lots of aunts and uncles on both sides    Social History   Tobacco Use   Smoking status: Never   Smokeless tobacco: Never  Substance Use Topics   Alcohol use: No   Drug use: No    Home Medications Prior to Admission medications   Medication Sig Start Date End Date Taking? Authorizing Provider  metroNIDAZOLE (FLAGYL) 500 MG tablet Take 1 tablet (500 mg total) by mouth 2 (two) times daily. 08/01/21  Yes Baden Betsch, Raynelle Fanning, PA-C  bacitracin ointment Apply 1 application topically 2 (two) times daily. 05/28/21   Ivette Loyal, NP  Multiple Vitamins-Minerals (WOMENS MULTI VITAMIN & MINERAL PO) Take by mouth.    [provider]    Allergies    Patient has no known allergies.  Review of Systems   Review of Systems  Constitutional:  Negative for fever.  HENT:  Negative for congestion and sore throat.   Eyes: Negative.   Respiratory:  Negative for chest tightness and shortness of breath.   Cardiovascular:  Negative for chest pain.  Gastrointestinal:  Negative for abdominal pain and nausea.  Genitourinary:  Positive for menstrual problem, vaginal bleeding and vaginal discharge. Negative for dysuria, flank pain, pelvic pain and vaginal pain.  Musculoskeletal:  Negative for arthralgias, joint swelling and neck pain.  Skin: Negative.  Negative for rash and wound.  Neurological:  Negative for dizziness, weakness, light-headedness, numbness and headaches.  Psychiatric/Behavioral: Negative.    All other systems reviewed and are negative.  Physical Exam Updated Vital Signs BP 119/84 (BP Location: Right Arm)   Pulse 66   Temp 98 F (36.7 C) (Oral)   Resp 16   Ht 5\' 7"  (1.702 m)   Wt 88.7 kg   LMP 07/18/2021   SpO2 100%   BMI 30.63 kg/m   Physical Exam Vitals and nursing note reviewed. Exam conducted with a chaperone present.  Constitutional:      Appearance: She is well-developed.  HENT:     Head:  Normocephalic and atraumatic.  Eyes:     Conjunctiva/sclera: Conjunctivae normal.  Cardiovascular:     Rate and Rhythm: Normal rate and regular rhythm.     Heart sounds: Normal heart sounds.  Pulmonary:     Effort: Pulmonary effort is normal.     Breath sounds: Normal breath sounds. No wheezing.  Abdominal:     General: Bowel sounds are normal.     Palpations: Abdomen is soft.     Tenderness: There is no abdominal tenderness.  Genitourinary:    Vagina: Vaginal discharge present. No erythema, tenderness or bleeding.     Cervix: No cervical motion tenderness, friability or erythema.     Uterus: Normal.      Adnexa: Right adnexa normal and left adnexa normal.     Comments: Small amount of creamy vaginal dc.  No vaginal or cervical erythema or tenderness.  Musculoskeletal:        General: Normal range of motion.     Cervical back: Normal range of motion.  Skin:    General: Skin is warm and dry.  Neurological:     Mental Status: She is alert.    ED Results / Procedures / Treatments   Labs (all labs ordered are listed, but only abnormal results are displayed) Labs Reviewed  WET PREP, GENITAL - Abnormal; Notable for the following components:      Result Value   Trich, Wet Prep PRESENT (*)    WBC, Wet Prep HPF POC FEW (*)    All other components within normal limits  URINALYSIS, ROUTINE W REFLEX MICROSCOPIC - Abnormal; Notable for the following components:   APPearance HAZY (*)    Leukocytes,Ua LARGE (*)    Bacteria, UA FEW (*)    Trichomonas, UA PRESENT (*)    All other components within normal limits  POC URINE PREG, ED  GC/CHLAMYDIA PROBE AMP ([place]) NOT AT [facility]    EKG None  Radiology No results found.  Procedures Procedures   Medications Ordered in ED Medications - No data to display  ED Course  I have reviewed the triage vital signs and the nursing notes.  Pertinent labs & imaging results that were available during my care of the patient were reviewed  by me and considered in my medical decision making (see chart for details).    MDM Rules/Calculators/A&P                           Labs reviewed and discussed with patient.  She was started on Flagyl for  trichomonas.  We discussed that her partner will need to be treated as well prior to resuming intercourse or they will not resolve this infection.  We also discussed that her gonorrhea and Chlamydia tests are pending at this time.  We discussed treatment today versus waiting for these test results.  Her exam does not suggest cervicitis, I doubt these pending cultures will be positive.  Patient would like to wait for these cultures to result prior to possible treatment. Final Clinical Impression(s) / ED Diagnoses Final diagnoses:  Vaginal bleeding  Trichomonas infection    Rx / DC Orders ED Discharge Orders          Ordered    metroNIDAZOLE (FLAGYL) 500 MG tablet  2 times daily        08/01/21 1710             Burgess Amor, Cordelia Poche 08/01/21 1732    Bethann Berkshire, MD 08/08/21 1726

## 2021-08-02 LAB — GC/CHLAMYDIA PROBE AMP (~~LOC~~) NOT AT ARMC
Chlamydia: NEGATIVE
Comment: NEGATIVE
Comment: NORMAL
Neisseria Gonorrhea: NEGATIVE

## 2023-10-02 ENCOUNTER — Encounter: Payer: Self-pay | Admitting: Family Medicine

## 2023-10-02 ENCOUNTER — Ambulatory Visit: Payer: Medicaid Other | Admitting: Family Medicine

## 2023-10-02 VITALS — BP 115/77 | HR 88 | Temp 98.8°F | Resp 16 | Ht 61.0 in | Wt 200.6 lb

## 2023-10-02 DIAGNOSIS — Z7689 Persons encountering health services in other specified circumstances: Secondary | ICD-10-CM

## 2023-10-02 DIAGNOSIS — N644 Mastodynia: Secondary | ICD-10-CM

## 2023-10-03 ENCOUNTER — Encounter: Payer: Self-pay | Admitting: Family Medicine

## 2023-10-03 NOTE — Progress Notes (Signed)
New Patient Office Visit  Subjective    Patient ID: Sydney Alexander, female    DOB: Mar 05, 1986  Age: 37 y.o. MRN: [id number]  CC:  Chief Complaint  Patient presents with   Establish Care    Left breast pain    HPI Sydney Alexander presents to establish care and for complaint of left breast pain of about 2-3 weeks duration. Patient denies known trauma or injury. Breast is sensitive to touch.    Outpatient Encounter Medications as of 10/02/2023  Medication Sig   bacitracin ointment Apply 1 application topically 2 (two) times daily. (Patient not taking: Reported on 10/02/2023)   metroNIDAZOLE (FLAGYL) 500 MG tablet Take 1 tablet (500 mg total) by mouth 2 (two) times daily. (Patient not taking: Reported on 10/02/2023)   Multiple Vitamins-Minerals (WOMENS MULTI VITAMIN & MINERAL PO) Take by mouth. (Patient not taking: Reported on 10/02/2023)   No facility-administered encounter medications on file as of 10/02/2023.    Past Medical History:  Diagnosis Date   Abnormal pap    BV (bacterial vaginosis)    Contraceptive management 03/10/2015   Cramping affecting pregnancy, antepartum 03/26/2016   Encounter for IUD removal 03/10/2015   Vaginal discharge 03/26/2016   Vaginal irritation 03/26/2016   Vaginal Pap smear, abnormal     Past Surgical History:  Procedure Laterality Date   LAPAROSCOPIC TUBAL LIGATION Bilateral 12/19/2016   Procedure: LAPAROSCOPIC BILATERAL TUBAL LIGATION USING ELECTROCAUTERY;  Surgeon: Lazaro Arms, MD;  Location: [facility];  Service: Gynecology;  Laterality: Bilateral;   NO PAST SURGERIES      Family History  Problem Relation Age of Onset   Cancer Maternal Grandmother    Thyroid disease Mother    Cancer Paternal Aunt    Diabetes Other        lots of aunts and uncles on both sides    Social History   Socioeconomic History   Marital status: Single    Spouse name: Not on file   Number of children: Not on file   Years of education: Not on file   Highest  education level: Not on file  Occupational History   Not on file  Tobacco Use   Smoking status: Never   Smokeless tobacco: Never  Substance and Sexual Activity   Alcohol use: No   Drug use: No   Sexual activity: Yes    Birth control/protection: Surgical    Comment: tubal  Other Topics Concern   Not on file  Social History Narrative   Not on file   Social Drivers of Health   Financial Resource Strain: Low Risk  (02/13/2021)   Overall Financial Resource Strain (CARDIA)    Difficulty of Paying Living Expenses: Not hard at all  Food Insecurity: No Food Insecurity (02/13/2021)   Hunger Vital Sign    Worried About Running Out of Food in the Last Year: Never true    Ran Out of Food in the Last Year: Never true  Transportation Needs: No Transportation Needs (02/13/2021)   PRAPARE - Administrator, Civil Service (Medical): No    Lack of Transportation (Non-Medical): No  Physical Activity: Insufficiently Active (02/13/2021)   Exercise Vital Sign    Days of Exercise per Week: 3 days    Minutes of Exercise per Session: 30 min  Stress: No Stress Concern Present (02/13/2021)   [employer] of Occupational Health - Occupational Stress Questionnaire    Feeling of Stress : Not at all  Social Connections: Moderately  Isolated (02/13/2021)   Social Connection and Isolation Panel [NHANES]    Frequency of Communication with Friends and Family: Three times a week    Frequency of Social Gatherings with Friends and Family: Once a week    Attends Religious Services: More than 4 times per year    Active Member of [employer] or Organizations: No    Attends Banker Meetings: Never    Marital Status: Never married  Intimate Partner Violence: Not At Risk (02/13/2021)   Humiliation, Afraid, Rape, and Kick questionnaire    Fear of Current or Ex-Partner: No    Emotionally Abused: No    Physically Abused: No    Sexually Abused: No    Review of Systems  All other systems reviewed  and are negative.       Objective   BP 115/77 (BP Location: Right Arm, Patient Position: Sitting, Cuff Size: Large)   Pulse 88   Temp 98.8 F (37.1 C) (Oral)   Resp 16   Ht 5\' 1"  (1.549 m)   Wt 200 lb 9.6 oz (91 kg)   SpO2 97%   BMI 37.90 kg/m   Physical Exam Vitals and nursing note reviewed.  Constitutional:      General: She is not in acute distress. Cardiovascular:     Rate and Rhythm: Normal rate and regular rhythm.  Pulmonary:     Effort: Pulmonary effort is normal.     Breath sounds: Normal breath sounds.  Chest:  Breasts:    Right: Normal.     Left: Mass and tenderness present. No nipple discharge.    Neurological:     General: No focal deficit present.     Mental Status: She is alert and oriented to person, place, and time.         Assessment & Plan:   Breast pain, left -     MM 3D DIAGNOSTIC MAMMOGRAM BILATERAL BREAST; Future -     [place] LIMITED ULTRASOUND INCLUDING AXILLA LEFT BREAST ; Future  Encounter to establish care     No follow-ups on file.   Tommie Raymond, MD

## 2023-10-07 ENCOUNTER — Ambulatory Visit
Admission: RE | Admit: 2023-10-07 | Discharge: 2023-10-07 | Disposition: A | Discharge status: Home / Self Care | Payer: No Typology Code available for payment source | Source: Ambulatory Visit | Attending: Family Medicine | Admitting: Family Medicine

## 2023-10-07 ENCOUNTER — Ambulatory Visit
Admission: RE | Admit: 2023-10-07 | Discharge: 2023-10-07 | Disposition: A | Discharge status: Home / Self Care | Payer: Medicaid Other | Source: Ambulatory Visit | Attending: Family Medicine | Admitting: Family Medicine

## 2023-10-07 DIAGNOSIS — N644 Mastodynia: Secondary | ICD-10-CM

## 2023-11-04 ENCOUNTER — Ambulatory Visit (INDEPENDENT_AMBULATORY_CARE_PROVIDER_SITE_OTHER): Payer: 59 | Admitting: Family Medicine

## 2023-11-04 VITALS — BP 130/84 | HR 89 | Wt 202.6 lb

## 2023-11-04 DIAGNOSIS — Z7689 Persons encountering health services in other specified circumstances: Secondary | ICD-10-CM

## 2023-11-04 DIAGNOSIS — E66812 Obesity, class 2: Secondary | ICD-10-CM

## 2023-11-04 DIAGNOSIS — Z6838 Body mass index (BMI) 38.0-38.9, adult: Secondary | ICD-10-CM

## 2023-11-04 DIAGNOSIS — Z5986 Financial insecurity: Secondary | ICD-10-CM

## 2023-11-04 DIAGNOSIS — E6609 Other obesity due to excess calories: Secondary | ICD-10-CM

## 2023-11-04 MED ORDER — WEGOVY 0.25 MG/0.5ML ~~LOC~~ SOAJ: 0.2500 mg | SUBCUTANEOUS | 0 refills | Status: DC

## 2023-11-04 MED ORDER — PHENTERMINE HCL 37.5 MG PO TABS: 37.5000 mg | ORAL_TABLET | Freq: Every day | ORAL | 0 refills | Status: DC

## 2023-11-04 NOTE — Progress Notes (Signed)
 Established Patient Office Visit  Subjective    Patient ID: Sydney Alexander, female    DOB: October 28, 1985  Age: 38 y.o. MRN: [id number]  CC:  Chief Complaint  Patient presents with   Weight Management Screening    Patient is wanting medication for weight loss    HPI Sydney Alexander presents for routine weight management. Patient would like to transition from phentermine  to wegovy . Patient denies acute complaints.   Outpatient Encounter Medications as of 11/04/2023  Medication Sig   phentermine  (ADIPEX-P ) 37.5 MG tablet Take 1 tablet (37.5 mg total) by mouth daily before breakfast.   Semaglutide -Weight Management (WEGOVY ) 0.25 MG/0.5ML SOAJ Inject 0.25 mg into the skin once a week.   [DISCONTINUED] bacitracin  ointment Apply 1 application topically 2 (two) times daily. (Patient not taking: Reported on 10/02/2023)   [DISCONTINUED] metroNIDAZOLE  (FLAGYL ) 500 MG tablet Take 1 tablet (500 mg total) by mouth 2 (two) times daily. (Patient not taking: Reported on 10/02/2023)   [DISCONTINUED] Multiple Vitamins-Minerals (WOMENS MULTI VITAMIN & MINERAL PO) Take by mouth. (Patient not taking: Reported on 10/02/2023)   No facility-administered encounter medications on file as of 11/04/2023.    Past Medical History:  Diagnosis Date   Abnormal pap    BV (bacterial vaginosis)    Contraceptive management 03/10/2015   Cramping affecting pregnancy, antepartum 03/26/2016   Encounter for IUD removal 03/10/2015   Vaginal discharge 03/26/2016   Vaginal irritation 03/26/2016   Vaginal Pap smear, abnormal     Past Surgical History:  Procedure Laterality Date   LAPAROSCOPIC TUBAL LIGATION Bilateral 12/19/2016   Procedure: LAPAROSCOPIC BILATERAL TUBAL LIGATION USING ELECTROCAUTERY;  Surgeon: Vonn VEAR Inch, MD;  Location: [facility];  Service: Gynecology;  Laterality: Bilateral;   NO PAST SURGERIES      Family History  Problem Relation Age of Onset   Cancer Maternal Grandmother    Thyroid  disease Mother    Cancer  Paternal Aunt    Diabetes Other        lots of aunts and uncles on both sides    Social History   Socioeconomic History   Marital status: Single    Spouse name: Not on file   Number of children: Not on file   Years of education: Not on file   Highest education level: Some college, no degree  Occupational History   Not on file  Tobacco Use   Smoking status: Never   Smokeless tobacco: Never  Substance and Sexual Activity   Alcohol use: No   Drug use: No   Sexual activity: Yes    Birth control/protection: Surgical    Comment: tubal  Other Topics Concern   Not on file  Social History Narrative   Not on file   Social Drivers of Health   Financial Resource Strain: Medium Risk (11/04/2023)   Overall Financial Resource Strain (CARDIA)    Difficulty of Paying Living Expenses: Somewhat hard  Food Insecurity: No Food Insecurity (11/04/2023)   Hunger Vital Sign    Worried About Running Out of Food in the Last Year: Never true    Ran Out of Food in the Last Year: Never true  Transportation Needs: No Transportation Needs (11/04/2023)   PRAPARE - Administrator, Civil Service (Medical): No    Lack of Transportation (Non-Medical): No  Physical Activity: Insufficiently Active (11/04/2023)   Exercise Vital Sign    Days of Exercise per Week: 2 days    Minutes of Exercise per Session: 30 min  Stress: No Stress Concern Present (11/04/2023)   [employer] of Occupational Health - Occupational Stress Questionnaire    Feeling of Stress : Not at all  Social Connections: Socially Isolated (11/04/2023)   Social Connection and Isolation Panel [NHANES]    Frequency of Communication with Friends and Family: Once a week    Frequency of Social Gatherings with Friends and Family: Never    Attends Religious Services: More than 4 times per year    Active Member of [employer] or Organizations: No    Attends Engineer, Structural: Not on file    Marital Status: Never married   Intimate Partner Violence: Not At Risk (02/13/2021)   Humiliation, Afraid, Rape, and Kick questionnaire    Fear of Current or Ex-Partner: No    Emotionally Abused: No    Physically Abused: No    Sexually Abused: No    Review of Systems  All other systems reviewed and are negative.       Objective    BP 130/84 (BP Location: Right Arm, Patient Position: Sitting, Cuff Size: Normal)   Pulse 89   Wt 202 lb 9.6 oz (91.9 kg)   LMP 09/30/2023 (Approximate)   SpO2 95%   BMI 38.28 kg/m   Physical Exam Vitals and nursing note reviewed.  Constitutional:      General: She is not in acute distress.    Appearance: She is obese.  Cardiovascular:     Rate and Rhythm: Normal rate and regular rhythm.  Pulmonary:     Effort: Pulmonary effort is normal.     Breath sounds: Normal breath sounds.  Abdominal:     Palpations: Abdomen is soft.     Tenderness: There is no abdominal tenderness.  Neurological:     General: No focal deficit present.     Mental Status: She is alert and oriented to person, place, and time.         Assessment & Plan:   Encounter for weight management  Class 2 obesity due to excess calories without serious comorbidity with body mass index (BMI) of 38.0 to 38.9 in adult  Other orders -     Phentermine  HCl; Take 1 tablet (37.5 mg total) by mouth daily before breakfast.  Dispense: 30 tablet; Refill: 0 -     Wegovy ; Inject 0.25 mg into the skin once a week.  Dispense: 2 mL; Refill: 0   Patient prescribed phentermine  for bridge until  when/if wegovy  is authorized   Return in about 4 weeks (around 12/02/2023) for follow up, weight management.   Sydney Raguel SQUIBB, MD

## 2023-11-05 ENCOUNTER — Other Ambulatory Visit: Payer: Self-pay

## 2023-11-07 ENCOUNTER — Other Ambulatory Visit: Payer: Self-pay | Admitting: Family Medicine

## 2023-11-07 NOTE — Telephone Encounter (Signed)
PA needed, please advise

## 2023-11-07 NOTE — Telephone Encounter (Signed)
Patient stated Semaglutide-Weight Management (WEGOVY) 0.25 MG/0.5ML needs a prior auth before pharmacy would fill.

## 2023-11-08 ENCOUNTER — Other Ambulatory Visit: Payer: Self-pay

## 2023-11-12 ENCOUNTER — Other Ambulatory Visit: Payer: Self-pay

## 2023-11-12 ENCOUNTER — Telehealth: Payer: Self-pay

## 2023-11-12 NOTE — Telephone Encounter (Signed)
-----   Message from RMA [place] E sent at 11/08/2023 11:38 AM EST ----- Regarding: FW: PA PA for wegovy ----- Message ----- From: Weldon Picking, CPhT Sent: 11/08/2023   9:25 AM EST To: Kieth Brightly, RMA Subject: RE: PA                                         Can you please confirm that patient is on phentermine and wegovy? ----- Message ----- From: Kieth Brightly, RMA Sent: 11/05/2023   3:17 PM EST To: Weldon Picking, CPhT Subject: PA                                              PA for Phentermine HCl 37.5MG  tablets

## 2023-11-12 NOTE — Telephone Encounter (Signed)
Pharmacy Patient Advocate Encounter   Received notification from CoverMyMeds that prior authorization for [facility] is required/requested.   Insurance verification completed.   The patient is insured through [employer] .   Per test claim: PA required; PA submitted to above mentioned insurance via CoverMyMeds Key/confirmation #/EOC [id number] Status is pending

## 2023-11-13 ENCOUNTER — Other Ambulatory Visit: Payer: Self-pay

## 2023-11-13 NOTE — Telephone Encounter (Signed)
Pharmacy Patient Advocate Encounter  Received notification from [employer] [facility] that Prior Authorization for [facility] has been APPROVED from 11/12/2023 to 06/11/2024   PA #/Case ID/Reference #: [id number]

## 2023-12-02 ENCOUNTER — Ambulatory Visit: Payer: 59 | Admitting: Family Medicine

## 2023-12-02 ENCOUNTER — Encounter: Payer: Self-pay | Admitting: Family Medicine

## 2023-12-02 VITALS — BP 110/78 | HR 70 | Temp 98.5°F | Resp 16 | Ht 61.0 in | Wt 200.4 lb

## 2023-12-02 DIAGNOSIS — Z7689 Persons encountering health services in other specified circumstances: Secondary | ICD-10-CM

## 2023-12-02 DIAGNOSIS — E66812 Obesity, class 2: Secondary | ICD-10-CM

## 2023-12-02 DIAGNOSIS — E6609 Other obesity due to excess calories: Secondary | ICD-10-CM

## 2023-12-02 DIAGNOSIS — Z6837 Body mass index (BMI) 37.0-37.9, adult: Secondary | ICD-10-CM

## 2023-12-02 MED ORDER — WEGOVY 0.5 MG/0.5ML ~~LOC~~ SOAJ: 0.5000 mg | SUBCUTANEOUS | 0 refills | Status: DC

## 2023-12-04 ENCOUNTER — Encounter: Payer: Self-pay | Admitting: Family Medicine

## 2023-12-04 NOTE — Progress Notes (Signed)
Established Patient Office Visit  Subjective    Patient ID: Sydney Alexander, female    DOB: 1986/06/15  Age: 38 y.o. MRN: [id number]  CC:  Chief Complaint  Patient presents with   Weight Management Screening    HPI Sydney Alexander presents is here for routine weight management. Patient reports that she is doing well on wegovy without SE. She is wanting to increase the dosage.   Outpatient Encounter Medications as of 12/02/2023  Medication Sig   Semaglutide-Weight Management (WEGOVY) 0.25 MG/0.5ML SOAJ Inject 0.25 mg into the skin once a week.   Semaglutide-Weight Management (WEGOVY) 0.5 MG/0.5ML SOAJ Inject 0.5 mg into the skin once a week.   phentermine (ADIPEX-P) 37.5 MG tablet Take 1 tablet (37.5 mg total) by mouth daily before breakfast. (Patient not taking: Reported on 12/02/2023)   No facility-administered encounter medications on file as of 12/02/2023.    Past Medical History:  Diagnosis Date   Abnormal pap    BV (bacterial vaginosis)    Contraceptive management 03/10/2015   Cramping affecting pregnancy, antepartum 03/26/2016   Encounter for IUD removal 03/10/2015   Vaginal discharge 03/26/2016   Vaginal irritation 03/26/2016   Vaginal Pap smear, abnormal     Past Surgical History:  Procedure Laterality Date   LAPAROSCOPIC TUBAL LIGATION Bilateral 12/19/2016   Procedure: LAPAROSCOPIC BILATERAL TUBAL LIGATION USING ELECTROCAUTERY;  Surgeon: Lazaro Arms, MD;  Location: [facility];  Service: Gynecology;  Laterality: Bilateral;   NO PAST SURGERIES      Family History  Problem Relation Age of Onset   Cancer Maternal Grandmother    Thyroid disease Mother    Cancer Paternal Aunt    Diabetes Other        lots of aunts and uncles on both sides    Social History   Socioeconomic History   Marital status: Single    Spouse name: Not on file   Number of children: Not on file   Years of education: Not on file   Highest education level: Some college, no degree  Occupational  History   Not on file  Tobacco Use   Smoking status: Never   Smokeless tobacco: Never  Substance and Sexual Activity   Alcohol use: No   Drug use: No   Sexual activity: Yes    Birth control/protection: Surgical    Comment: tubal  Other Topics Concern   Not on file  Social History Narrative   Not on file   Social Drivers of Health   Financial Resource Strain: Medium Risk (11/04/2023)   Overall Financial Resource Strain (CARDIA)    Difficulty of Paying Living Expenses: Somewhat hard  Food Insecurity: No Food Insecurity (11/04/2023)   Hunger Vital Sign    Worried About Running Out of Food in the Last Year: Never true    Ran Out of Food in the Last Year: Never true  Transportation Needs: No Transportation Needs (11/04/2023)   PRAPARE - Administrator, Civil Service (Medical): No    Lack of Transportation (Non-Medical): No  Physical Activity: Insufficiently Active (11/04/2023)   Exercise Vital Sign    Days of Exercise per Week: 2 days    Minutes of Exercise per Session: 30 min  Stress: No Stress Concern Present (11/04/2023)   [employer] of Occupational Health - Occupational Stress Questionnaire    Feeling of Stress : Not at all  Social Connections: Socially Isolated (11/04/2023)   Social Connection and Isolation Panel [NHANES]    Frequency  of Communication with Friends and Family: Once a week    Frequency of Social Gatherings with Friends and Family: Never    Attends Religious Services: More than 4 times per year    Active Member of [employer] or Organizations: No    Attends Engineer, structural: Not on file    Marital Status: Never married  Intimate Partner Violence: Not At Risk (02/13/2021)   Humiliation, Afraid, Rape, and Kick questionnaire    Fear of Current or Ex-Partner: No    Emotionally Abused: No    Physically Abused: No    Sexually Abused: No    Review of Systems  All other systems reviewed and are negative.       Objective    BP 110/78  (BP Location: Right Arm, Patient Position: Sitting, Cuff Size: Large)   Pulse 70   Temp 98.5 F (36.9 C) (Oral)   Resp 16   Ht 5\' 1"  (1.549 m)   Wt 200 lb 6.4 oz (90.9 kg)   SpO2 98%   BMI 37.87 kg/m   Physical Exam Vitals and nursing note reviewed.  Constitutional:      General: She is not in acute distress.    Appearance: She is obese.  Cardiovascular:     Rate and Rhythm: Normal rate and regular rhythm.  Pulmonary:     Effort: Pulmonary effort is normal.     Breath sounds: Normal breath sounds.  Abdominal:     Palpations: Abdomen is soft.     Tenderness: There is no abdominal tenderness.  Neurological:     General: No focal deficit present.     Mental Status: She is alert and oriented to person, place, and time.         Assessment & Plan:   Encounter for weight management  Class 2 obesity due to excess calories without serious comorbidity with body mass index (BMI) of 37.0 to 37.9 in adult  Other orders -     [id number]; Inject 0.5 mg into the skin once a week.  Dispense: 2 mL; Refill: 0     Return in about 6 weeks (around 01/13/2024) for follow up, weight management.   Tommie Raymond, MD

## 2024-01-06 ENCOUNTER — Other Ambulatory Visit: Payer: Self-pay | Admitting: Family Medicine

## 2024-01-06 NOTE — Telephone Encounter (Signed)
 Copied from CRM [id number]. Topic: Clinical - Medication Refill >> Jan 06, 2024 10:42 AM Franchot Heidelberg wrote: Most Recent Primary Care Visit:  Provider: Georganna Skeans  Department: [facility]  Visit Type: OFFICE VISIT  Date: 12/02/2023  Medication: Semaglutide-Weight Management (WEGOVY) 0.5 MG/0.5ML SOAJ   Has the patient contacted their pharmacy? Yes (Agent: If no, request that the patient contact the pharmacy for the refill. If patient does not wish to contact the pharmacy document the reason why and proceed with request.) (Agent: If yes, when and what did the pharmacy advise?)  Is this the correct pharmacy for this prescription? Yes If no, delete pharmacy and type the correct one.  This is the patient's preferred pharmacy:   [facility] [address] [place] - [address] [place] [address] [place] [address] Phone: [phone] Fax: [phone]   Has the prescription been filled recently? Yes  Is the patient out of the medication? Yes  Has the patient been seen for an appointment in the last year OR does the patient have an upcoming appointment? Yes  Can we respond through MyChart? Yes  Agent: Please be advised that Rx refills may take up to 3 business days. We ask that you follow-up with your pharmacy.

## 2024-01-07 MED ORDER — WEGOVY 0.5 MG/0.5ML ~~LOC~~ SOAJ: 0.5000 mg | SUBCUTANEOUS | 2 refills | Status: DC

## 2024-01-07 NOTE — Telephone Encounter (Signed)
 Requested Prescriptions  Pending Prescriptions Disp Refills   Semaglutide-Weight Management (WEGOVY) 0.5 MG/0.5ML SOAJ 2 mL 2    Sig: Inject 0.5 mg into the skin once a week.     Endocrinology:  Diabetes - GLP-1 Receptor Agonists - semaglutide Failed - 01/07/2024  3:03 PM      Failed - HBA1C in normal range and within 180 days    No results found for: "HGBA1C", "LABA1C"       Failed - Cr in normal range and within 360 days    Creatinine, Ser  Date Value Ref Range Status  12/17/2016 0.65 0.44 - 1.00 mg/dL Final         Passed - Valid encounter within last 6 months    Recent Outpatient Visits           1 month ago Encounter for weight management   [place] [facility], MD   2 months ago Encounter for weight management   [place] [facility], MD   3 months ago Breast pain, left   [place] [facility], MD       Future Appointments             In 1 week Georganna Skeans, MD [facility]

## 2024-01-20 ENCOUNTER — Ambulatory Visit: Payer: No Typology Code available for payment source | Admitting: Family Medicine

## 2024-01-20 ENCOUNTER — Encounter: Payer: Self-pay | Admitting: Family Medicine

## 2024-01-20 VITALS — BP 116/78 | HR 80 | Temp 98.4°F | Resp 16 | Ht 61.0 in | Wt 189.4 lb

## 2024-01-20 DIAGNOSIS — Z6835 Body mass index (BMI) 35.0-35.9, adult: Secondary | ICD-10-CM

## 2024-01-20 DIAGNOSIS — E66812 Obesity, class 2: Secondary | ICD-10-CM

## 2024-01-20 DIAGNOSIS — E6609 Other obesity due to excess calories: Secondary | ICD-10-CM

## 2024-01-20 DIAGNOSIS — Z7689 Persons encountering health services in other specified circumstances: Secondary | ICD-10-CM | POA: Diagnosis not present

## 2024-01-20 MED ORDER — WEGOVY 1 MG/0.5ML ~~LOC~~ SOAJ: 1.0000 mg | SUBCUTANEOUS | 0 refills | Status: DC

## 2024-01-23 ENCOUNTER — Encounter: Payer: Self-pay | Admitting: Family Medicine

## 2024-01-23 NOTE — Progress Notes (Signed)
 Established Patient Office Visit  Subjective    Patient ID: Sydney Alexander, female    DOB: 06/21/1986  Age: 38 y.o. MRN: [id number]  CC:  Chief Complaint  Patient presents with   Follow-up    6 week weight management    HPI [name] presents for routine weight management. Patient reports that she is doing well and denies acute complaints.   Outpatient Encounter Medications as of 01/20/2024  Medication Sig   Semaglutide-Weight Management (WEGOVY) 0.5 MG/0.5ML SOAJ Inject 0.5 mg into the skin once a week.   Semaglutide-Weight Management (WEGOVY) 1 MG/0.5ML SOAJ Inject 1 mg into the skin once a week.   phentermine (ADIPEX-P) 37.5 MG tablet Take 1 tablet (37.5 mg total) by mouth daily before breakfast. (Patient not taking: Reported on 12/02/2023)   Semaglutide-Weight Management (WEGOVY) 0.25 MG/0.5ML SOAJ Inject 0.25 mg into the skin once a week. (Patient not taking: Reported on 01/20/2024)   No facility-administered encounter medications on file as of 01/20/2024.    Past Medical History:  Diagnosis Date   Abnormal pap    BV (bacterial vaginosis)    Contraceptive management 03/10/2015   Cramping affecting pregnancy, antepartum 03/26/2016   Encounter for IUD removal 03/10/2015   Vaginal discharge 03/26/2016   Vaginal irritation 03/26/2016   Vaginal Pap smear, abnormal     Past Surgical History:  Procedure Laterality Date   LAPAROSCOPIC TUBAL LIGATION Bilateral 12/19/2016   Procedure: LAPAROSCOPIC BILATERAL TUBAL LIGATION USING ELECTROCAUTERY;  Surgeon: Lazaro Arms, MD;  Location: [facility];  Service: Gynecology;  Laterality: Bilateral;   NO PAST SURGERIES      Family History  Problem Relation Age of Onset   Cancer Maternal Grandmother    Thyroid disease Mother    Cancer Paternal Aunt    Diabetes Other        lots of aunts and uncles on both sides    Social History   Socioeconomic History   Marital status: Single    Spouse name: Not on file   Number of children: 3    Years of education: Not on file   Highest education level: Some college, no degree  Occupational History   Not on file  Tobacco Use   Smoking status: Never   Smokeless tobacco: Never  Vaping Use   Vaping status: Never Used  Substance and Sexual Activity   Alcohol use: No   Drug use: No   Sexual activity: Yes    Birth control/protection: Surgical    Comment: tubal  Other Topics Concern   Not on file  Social History Narrative   Not on file   Social Drivers of Health   Financial Resource Strain: Medium Risk (11/04/2023)   Overall Financial Resource Strain (CARDIA)    Difficulty of Paying Living Expenses: Somewhat hard  Food Insecurity: No Food Insecurity (11/04/2023)   Hunger Vital Sign    Worried About Running Out of Food in the Last Year: Never true    Ran Out of Food in the Last Year: Never true  Transportation Needs: No Transportation Needs (11/04/2023)   PRAPARE - Administrator, Civil Service (Medical): No    Lack of Transportation (Non-Medical): No  Physical Activity: Insufficiently Active (11/04/2023)   Exercise Vital Sign    Days of Exercise per Week: 2 days    Minutes of Exercise per Session: 30 min  Stress: No Stress Concern Present (11/04/2023)   [employer] of Occupational Health - Occupational Stress Questionnaire  Feeling of Stress : Not at all  Social Connections: Socially Isolated (11/04/2023)   Social Connection and Isolation Panel [NHANES]    Frequency of Communication with Friends and Family: Once a week    Frequency of Social Gatherings with Friends and Family: Never    Attends Religious Services: More than 4 times per year    Active Member of [employer] or Organizations: No    Attends Engineer, structural: Not on file    Marital Status: Never married  Intimate Partner Violence: Not At Risk (02/13/2021)   Humiliation, Afraid, Rape, and Kick questionnaire    Fear of Current or Ex-Partner: No    Emotionally Abused: No    Physically  Abused: No    Sexually Abused: No    Review of Systems  All other systems reviewed and are negative.       Objective    BP 116/78   Pulse 80   Temp 98.4 F (36.9 C) (Oral)   Resp 16   Ht 5\' 1"  (1.549 m)   Wt 189 lb 6.4 oz (85.9 kg)   SpO2 95%   BMI 35.79 kg/m   Physical Exam Vitals and nursing note reviewed.  Constitutional:      General: She is not in acute distress.    Appearance: She is obese.  Cardiovascular:     Rate and Rhythm: Normal rate and regular rhythm.  Pulmonary:     Effort: Pulmonary effort is normal.     Breath sounds: Normal breath sounds.  Abdominal:     Palpations: Abdomen is soft.     Tenderness: There is no abdominal tenderness.  Neurological:     General: No focal deficit present.     Mental Status: She is alert and oriented to person, place, and time.         Assessment & Plan:   Encounter for weight management  Class 2 obesity due to excess calories without serious comorbidity with body mass index (BMI) of 35.0 to 35.9 in adult  Other orders -     XBJYNW; Inject 1 mg into the skin once a week.  Dispense: 2 mL; Refill: 0   Patient doing well with present management. Will increase dosing.   Return in about 4 weeks (around 02/17/2024) for follow up, weight management.   Sydney Raymond, MD

## 2024-01-29 ENCOUNTER — Ambulatory Visit (INDEPENDENT_AMBULATORY_CARE_PROVIDER_SITE_OTHER): Admitting: Family Medicine

## 2024-01-29 ENCOUNTER — Other Ambulatory Visit (HOSPITAL_COMMUNITY)
Admission: RE | Admit: 2024-01-29 | Discharge: 2024-01-29 | Disposition: A | Discharge status: Home / Self Care | Source: Ambulatory Visit | Attending: Family Medicine | Admitting: Family Medicine

## 2024-01-29 ENCOUNTER — Encounter: Payer: Self-pay | Admitting: Family Medicine

## 2024-01-29 VITALS — Wt 192.8 lb

## 2024-01-29 DIAGNOSIS — Z0000 Encounter for general adult medical examination without abnormal findings: Secondary | ICD-10-CM

## 2024-01-29 DIAGNOSIS — Z113 Encounter for screening for infections with a predominantly sexual mode of transmission: Secondary | ICD-10-CM | POA: Insufficient documentation

## 2024-01-29 DIAGNOSIS — Z13220 Encounter for screening for lipoid disorders: Secondary | ICD-10-CM | POA: Diagnosis not present

## 2024-01-29 DIAGNOSIS — Z124 Encounter for screening for malignant neoplasm of cervix: Secondary | ICD-10-CM

## 2024-01-29 DIAGNOSIS — Z130 Encounter for screening for diseases of the blood and blood-forming organs and certain disorders involving the immune mechanism: Secondary | ICD-10-CM

## 2024-01-30 ENCOUNTER — Encounter: Payer: Self-pay | Admitting: Family Medicine

## 2024-01-30 LAB — CMP14+EGFR
ALT: 18 IU/L (ref 0–32)
AST: 15 IU/L (ref 0–40)
Albumin: 4.1 g/dL (ref 3.9–4.9)
Alkaline Phosphatase: 47 IU/L (ref 44–121)
BUN/Creatinine Ratio: 10 (ref 9–23)
BUN: 7 mg/dL (ref 6–20)
Bilirubin Total: 0.3 mg/dL (ref 0.0–1.2)
CO2: 24 mmol/L (ref 20–29)
Calcium: 9.3 mg/dL (ref 8.7–10.2)
Chloride: 102 mmol/L (ref 96–106)
Creatinine, Ser: 0.7 mg/dL (ref 0.57–1.00)
Globulin, Total: 2.6 g/dL (ref 1.5–4.5)
Glucose: 65 mg/dL — ABNORMAL LOW (ref 70–99)
Potassium: 3.4 mmol/L — ABNORMAL LOW (ref 3.5–5.2)
Sodium: 140 mmol/L (ref 134–144)
Total Protein: 6.7 g/dL (ref 6.0–8.5)
eGFR: 114 mL/min/{1.73_m2} (ref >59)

## 2024-01-30 LAB — CBC WITH DIFFERENTIAL/PLATELET
Basophils Absolute: 0 10*3/uL (ref 0.0–0.2)
Basos: 0 %
EOS (ABSOLUTE): 0.1 10*3/uL (ref 0.0–0.4)
Eos: 2 %
Hematocrit: 42.1 % (ref 34.0–46.6)
Hemoglobin: 14.1 g/dL (ref 11.1–15.9)
Immature Grans (Abs): 0 10*3/uL (ref 0.0–0.1)
Immature Granulocytes: 0 %
Lymphocytes Absolute: 1.8 10*3/uL (ref 0.7–3.1)
Lymphs: 34 %
MCH: 30.7 pg (ref 26.6–33.0)
MCHC: 33.5 g/dL (ref 31.5–35.7)
MCV: 92 fL (ref 79–97)
Monocytes Absolute: 0.5 10*3/uL (ref 0.1–0.9)
Monocytes: 8 %
Neutrophils Absolute: 3 10*3/uL (ref 1.4–7.0)
Neutrophils: 56 %
Platelets: 246 10*3/uL (ref 150–450)
RBC: 4.6 x10E6/uL (ref 3.77–5.28)
RDW: 12.3 % (ref 11.7–15.4)
WBC: 5.4 10*3/uL (ref 3.4–10.8)

## 2024-01-30 LAB — CERVICOVAGINAL ANCILLARY ONLY
Bacterial Vaginitis (gardnerella): NEGATIVE
Candida Glabrata: NEGATIVE
Candida Vaginitis: NEGATIVE
Chlamydia: NEGATIVE
Comment: NEGATIVE
Comment: NEGATIVE
Comment: NEGATIVE
Comment: NEGATIVE
Comment: NEGATIVE
Comment: NORMAL
Neisseria Gonorrhea: NEGATIVE
Trichomonas: NEGATIVE

## 2024-01-30 LAB — LIPID PANEL
Chol/HDL Ratio: 3.5 ratio (ref 0.0–4.4)
Cholesterol, Total: 160 mg/dL (ref 100–199)
HDL: 46 mg/dL (ref >39)
LDL Chol Calc (NIH): 96 mg/dL (ref 0–99)
Triglycerides: 96 mg/dL (ref 0–149)
VLDL Cholesterol Cal: 18 mg/dL (ref 5–40)

## 2024-01-30 NOTE — Progress Notes (Signed)
 Established Patient Office Visit  Subjective    Patient ID: Sydney Alexander, female    DOB: 23-Apr-1986  Age: 38 y.o. MRN: [id number]  CC:  Chief Complaint  Patient presents with   Gynecologic Exam    Patient states that she is bleeding after sex  no pain during intercourse     HPI Sydney Alexander presents for routine annual exam with pap. Patient denies acute complaints.   Outpatient Encounter Medications as of 01/29/2024  Medication Sig   Semaglutide-Weight Management (WEGOVY) 1 MG/0.5ML SOAJ Inject 1 mg into the skin once a week.   phentermine (ADIPEX-P) 37.5 MG tablet Take 1 tablet (37.5 mg total) by mouth daily before breakfast. (Patient not taking: Reported on 12/02/2023)   Semaglutide-Weight Management (WEGOVY) 0.25 MG/0.5ML SOAJ Inject 0.25 mg into the skin once a week. (Patient not taking: Reported on 01/20/2024)   Semaglutide-Weight Management (WEGOVY) 0.5 MG/0.5ML SOAJ Inject 0.5 mg into the skin once a week.   No facility-administered encounter medications on file as of 01/29/2024.    Past Medical History:  Diagnosis Date   Abnormal pap    BV (bacterial vaginosis)    Contraceptive management 03/10/2015   Cramping affecting pregnancy, antepartum 03/26/2016   Encounter for IUD removal 03/10/2015   Vaginal discharge 03/26/2016   Vaginal irritation 03/26/2016   Vaginal Pap smear, abnormal     Past Surgical History:  Procedure Laterality Date   LAPAROSCOPIC TUBAL LIGATION Bilateral 12/19/2016   Procedure: LAPAROSCOPIC BILATERAL TUBAL LIGATION USING ELECTROCAUTERY;  Surgeon: Lazaro Arms, MD;  Location: [facility];  Service: Gynecology;  Laterality: Bilateral;   NO PAST SURGERIES      Family History  Problem Relation Age of Onset   Cancer Maternal Grandmother    Thyroid disease Mother    Cancer Paternal Aunt    Diabetes Other        lots of aunts and uncles on both sides    Social History   Socioeconomic History   Marital status: Single    Spouse name: Not on file    Number of children: 3   Years of education: Not on file   Highest education level: Some college, no degree  Occupational History   Not on file  Tobacco Use   Smoking status: Never   Smokeless tobacco: Never  Vaping Use   Vaping status: Never Used  Substance and Sexual Activity   Alcohol use: No   Drug use: No   Sexual activity: Yes    Birth control/protection: Surgical    Comment: tubal  Other Topics Concern   Not on file  Social History Narrative   Not on file   Social Drivers of Health   Financial Resource Strain: Medium Risk (11/04/2023)   Overall Financial Resource Strain (CARDIA)    Difficulty of Paying Living Expenses: Somewhat hard  Food Insecurity: No Food Insecurity (11/04/2023)   Hunger Vital Sign    Worried About Running Out of Food in the Last Year: Never true    Ran Out of Food in the Last Year: Never true  Transportation Needs: No Transportation Needs (11/04/2023)   PRAPARE - Administrator, Civil Service (Medical): No    Lack of Transportation (Non-Medical): No  Physical Activity: Insufficiently Active (11/04/2023)   Exercise Vital Sign    Days of Exercise per Week: 2 days    Minutes of Exercise per Session: 30 min  Stress: No Stress Concern Present (11/04/2023)   [employer] of Occupational Health -  Occupational Stress Questionnaire    Feeling of Stress : Not at all  Social Connections: Socially Isolated (11/04/2023)   Social Connection and Isolation Panel [NHANES]    Frequency of Communication with Friends and Family: Once a week    Frequency of Social Gatherings with Friends and Family: Never    Attends Religious Services: More than 4 times per year    Active Member of [employer] or Organizations: No    Attends Engineer, structural: Not on file    Marital Status: Never married  Intimate Partner Violence: Not At Risk (02/13/2021)   Humiliation, Afraid, Rape, and Kick questionnaire    Fear of Current or Ex-Partner: No    Emotionally  Abused: No    Physically Abused: No    Sexually Abused: No    Review of Systems  All other systems reviewed and are negative.       Objective    Wt 192 lb 12.8 oz (87.5 kg)   BMI 36.43 kg/m   Physical Exam Vitals and nursing note reviewed.  Constitutional:      General: She is not in acute distress.    Appearance: She is obese.  HENT:     Head: Normocephalic and atraumatic.     Right Ear: Tympanic membrane, ear canal and external ear normal.     Left Ear: Tympanic membrane, ear canal and external ear normal.     Nose: Nose normal.     Mouth/Throat:     Mouth: Mucous membranes are moist.     Pharynx: Oropharynx is clear.  Eyes:     Conjunctiva/sclera: Conjunctivae normal.     Pupils: Pupils are equal, round, and reactive to light.  Neck:     Thyroid: No thyromegaly.  Cardiovascular:     Rate and Rhythm: Normal rate and regular rhythm.     Heart sounds: Normal heart sounds. No murmur heard. Pulmonary:     Effort: Pulmonary effort is normal. No respiratory distress.     Breath sounds: Normal breath sounds.  Abdominal:     General: There is no distension.     Palpations: Abdomen is soft. There is no mass.     Tenderness: There is no abdominal tenderness.     Hernia: There is no hernia in the left inguinal area or right inguinal area.  Genitourinary:    Exam position: Supine.     Labia:        Right: No lesion.        Left: No lesion.      Vagina: Normal.     Cervix: Normal.     Uterus: Normal.      Adnexa: Right adnexa normal.     Rectum: Normal.  Musculoskeletal:        General: Normal range of motion.     Cervical back: Normal range of motion and neck supple.  Lymphadenopathy:     Lower Body: No right inguinal adenopathy. No left inguinal adenopathy.  Skin:    General: Skin is warm and dry.  Neurological:     General: No focal deficit present.     Mental Status: She is alert and oriented to person, place, and time.  Psychiatric:        Mood and  Affect: Mood normal.        Behavior: Behavior normal.         Assessment & Plan:   Annual physical exam -     CMP14+EGFR  Encounter for Papanicolaou smear of  cervix -     Cytology - PAP -     Cervicovaginal ancillary only  Screening for deficiency anemia -     CBC with Differential/Platelet  Screening for lipid disorders -     Lipid panel     No follow-ups on file.   Tommie Raymond, MD

## 2024-02-05 LAB — CYTOLOGY - PAP
Adequacy: ABSENT
Chlamydia: NEGATIVE
Comment: NEGATIVE
Comment: NEGATIVE
Comment: NEGATIVE
Comment: NORMAL
Diagnosis: NEGATIVE
High risk HPV: NEGATIVE
Neisseria Gonorrhea: NEGATIVE
Trichomonas: NEGATIVE

## 2024-02-17 ENCOUNTER — Ambulatory Visit: Payer: Self-pay

## 2024-02-17 NOTE — Telephone Encounter (Signed)
 Patient has appointment tomorrow with Rogerio Clay

## 2024-02-17 NOTE — Telephone Encounter (Signed)
 Chief Complaint: passed out Symptoms: passes out Frequency:  saturday Pertinent Negatives: Patient denies  Disposition: [] ED /[] Urgent Care (no appt availability in office) / [x] Appointment(In office/virtual)/ []  [place] [facility]/ [] Home Care/ [] Refused Recommended Disposition /[]  Mobile Bus/ []  Follow-up with PCP Additional Notes: states that on Saturday while at work she passes out for a few seconds. States that on Friday she increase her dose of wegovy  to 1mg  and she does not have an appetite and had only eating a few fries and some water . States that she she has still been feeling weak since Saturday.   Copied from CRM [id number]. Topic: Clinical - Red Word Triage >> Feb 17, 2024  8:39 AM Tiffany B wrote: Red Word that prompted transfer to Nurse Triage: Patient passed out at work on 4/26 in the morning and feels week. Patient started a higher dose of WEGOVY  unsure if that's the cause Reason for Disposition  [1] Fear, stress, or pain caused simple fainting AND [2] now alert and feels fine  [1] Caller has NON-URGENT medicine question about med that PCP prescribed AND [2] triager unable to answer question  Answer Assessment - Initial Assessment Questions 1. ONSET: "How long were you unconscious?" (minutes) "When did it happen?"     Few second 2. CONTENT: "What happened during period of unconsciousness?" (e.g., seizure activity)      nothing 3. MENTAL STATUS: "Alert and oriented now?" (oriented x 3 = name, month, location)      Alert and orients 4. TRIGGER: "What do you think caused the fainting?" "What were you doing just before you fainted?"  (e.g., exercise, sudden standing up, prolonged standing)     Not eating 5. RECURRENT SYMPTOM: "Have you ever passed out before?" If Yes, ask: "When was the last time?" and "What happened that time?"      Not eating 6. INJURY: "Did you sustain any injury during the fall?"      no 7. CARDIAC SYMPTOMS: "Have you had any of the following  symptoms: chest pain, difficulty breathing, palpitations?"     no 8. NEUROLOGIC SYMPTOMS: "Have you had any of the following symptoms: headache, numbness, vertigo, weakness?"     weakness 9. GI SYMPTOMS: "Have you had any of the following symptoms: abdomen pain, vomiting, diarrhea, blood in stools?"     no 10. OTHER SYMPTOMS: "Do you have any other symptoms?"       no 11. PREGNANCY: "Is there any chance you are pregnant?" "When was your last menstrual period?"       no  Answer Assessment - Initial Assessment Questions 1. NAME of MEDICINE: "What medicine(s) are you calling about?"     wegovy  2. QUESTION: "What is your question?" (e.g., double dose of medicine, side effect)     wegovy  3. PRESCRIBER: "Who prescribed the medicine?" Reason: if prescribed by specialist, call should be referred to that group.     wilson 4. SYMPTOMS: "Do you have any symptoms?" If Yes, ask: "What symptoms are you having?"  "How bad are the symptoms (e.g., mild, moderate, severe)     weakness  Protocols used: Fainting-A-AH, Medication Question Call-A-AH

## 2024-02-18 ENCOUNTER — Ambulatory Visit: Admitting: Internal Medicine

## 2024-02-18 ENCOUNTER — Encounter: Payer: Self-pay | Admitting: Family Medicine

## 2024-02-18 ENCOUNTER — Encounter: Payer: Self-pay | Admitting: Family

## 2024-02-18 ENCOUNTER — Ambulatory Visit (INDEPENDENT_AMBULATORY_CARE_PROVIDER_SITE_OTHER): Payer: Self-pay | Admitting: Family

## 2024-02-18 VITALS — BP 113/76 | HR 86 | Temp 98.3°F | Resp 16 | Ht 61.0 in | Wt 187.0 lb

## 2024-02-18 DIAGNOSIS — R531 Weakness: Secondary | ICD-10-CM

## 2024-02-18 DIAGNOSIS — Z7689 Persons encountering health services in other specified circumstances: Secondary | ICD-10-CM

## 2024-02-18 NOTE — Progress Notes (Signed)
 Patient ID: Sydney Alexander, female    DOB: 1986/08/29  MRN: [id number]  CC: Weakness   Subjective: Sydney Alexander is a 38 y.o. female who presents for weakness.   Her concerns today include:  Patient states she is taking Semaglutide -Weight Management which decreases her appetite. Patient states it is difficult to make herself eat when she is not hungry. Patient states Semaglutide -Weight Management dose was recently increased by her primary provider Sydney Abo, MD. Patient states 4 days ago she took the Semaglutide -Weight Management injection and due to a busy day at work, attending her sons's field trip, and preparing her daughter for prom she only ate a few fries that her son didn't want. Patient states while she was at work she "went down" and her fiance came to pick her up from work. Patient states she did not seek immediate medical care at the Emergency Department/Urgent Care due to she began to feel better in the days following and since then has been on a normal routine. States she feels back to normal with no limitations and ready to return to work. Patient recently had an annual physical exam with Sydney Abo, MD on 01/29/2024 and lab results were unremarkable. No further issues/concerns for discussion today.  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 2 mL 0   phentermine  (ADIPEX-P ) 37.5 MG tablet Take 1 tablet (37.5 mg total) by mouth daily before breakfast. (Patient not taking: Reported on 12/02/2023) 30 tablet 0   Semaglutide -Weight Management (WEGOVY ) 0.25 MG/0.5ML SOAJ  Inject 0.25 mg into the skin once a week. (Patient not taking: Reported on 01/20/2024) 2 mL 0   Semaglutide -Weight Management (WEGOVY ) 0.5 MG/0.5ML SOAJ Inject 0.5 mg into the skin once a week. (Patient not taking: Reported on 02/18/2024) 2 mL 2   No current facility-administered medications on file prior to visit.    No Known Allergies  Social History   Socioeconomic History   Marital status: Single    Spouse name: Not on file   Number of children: 3   Years of education: Not on file   Highest education level: Some college, no degree  Occupational History   Not on file  Tobacco Use   Smoking status: Never   Smokeless tobacco: Never  Vaping Use   Vaping status: Never Used  Substance and Sexual Activity   Alcohol use: No   Drug use: No   Sexual activity: Yes    Birth control/protection: Surgical    Comment: tubal  Other Topics Concern   Not on file  Social History Narrative   Not on file   Social Drivers of Health   Financial Resource Strain: Medium Risk (11/04/2023)   Overall Financial Resource Strain (CARDIA)    Difficulty of Paying Living Expenses: Somewhat hard  Food Insecurity: No Food Insecurity (11/04/2023)   Hunger Vital Sign    Worried About Running Out of Food in the Last Year: Never true    Ran Out of Food in the Last Year: Never true  Transportation Needs: No Transportation Needs (11/04/2023)   PRAPARE - Transportation  Lack of Transportation (Medical): No    Lack of Transportation (Non-Medical): No  Physical Activity: Insufficiently Active (11/04/2023)   Exercise Vital Sign    Days of Exercise per Week: 2 days    Minutes of Exercise per Session: 30 min  Stress: No Stress Concern Present (11/04/2023)   [employer] of Occupational Health - Occupational Stress Questionnaire    Feeling of Stress : Not at all  Social Connections: Socially Isolated (11/04/2023)   Social Connection and Isolation Panel [NHANES]    Frequency of Communication with Friends and  Family: Once a week    Frequency of Social Gatherings with Friends and Family: Never    Attends Religious Services: More than 4 times per year    Active Member of [employer] or Organizations: No    Attends Engineer, structural: Not on file    Marital Status: Never married  Intimate Partner Violence: Not At Risk (02/13/2021)   Humiliation, Afraid, Rape, and Kick questionnaire    Fear of Current or Ex-Partner: No    Emotionally Abused: No    Physically Abused: No    Sexually Abused: No    Family History  Problem Relation Age of Onset   Cancer Maternal Grandmother    Thyroid  disease Mother    Cancer Paternal Aunt    Diabetes Other        lots of aunts and uncles on both sides    Past Surgical History:  Procedure Laterality Date   LAPAROSCOPIC TUBAL LIGATION Bilateral 12/19/2016   Procedure: LAPAROSCOPIC BILATERAL TUBAL LIGATION USING ELECTROCAUTERY;  Surgeon: Wendelyn Halter, MD;  Location: [facility];  Service: Gynecology;  Laterality: Bilateral;   NO PAST SURGERIES      ROS: Review of Systems Negative except as stated above  PHYSICAL EXAM: BP 113/76   Pulse 86   Temp 98.3 F (36.8 C) (Oral)   Resp 16   Ht 5\' 1"  (1.549 m)   Wt 187 lb (84.8 kg)   LMP 02/07/2024 (Approximate)   SpO2 98%   BMI 35.33 kg/m   Physical Exam HENT:     Head: Normocephalic and atraumatic.     Nose: Nose normal.     Mouth/Throat:     Mouth: Mucous membranes are moist.     Pharynx: Oropharynx is clear.  Eyes:     Extraocular Movements: Extraocular movements intact.     Conjunctiva/sclera: Conjunctivae normal.     Pupils: Pupils are equal, round, and reactive to light.  Cardiovascular:     Rate and Rhythm: Normal rate and regular rhythm.     Pulses: Normal pulses.     Heart sounds: Normal heart sounds.  Pulmonary:     Effort: Pulmonary effort is normal.     Breath sounds: Normal breath sounds.  Musculoskeletal:        General: Normal range of motion.     Cervical back: Normal range of  motion and neck supple.  Neurological:     General: No focal deficit present.     Mental Status: She is alert and oriented to person, place, and time.  Psychiatric:        Mood and Affect: Mood normal.        Behavior: Behavior normal.    ASSESSMENT AND PLAN: 1. Encounter for weight management (Primary) 2. Weakness - Weakness likely secondary to patient does not eat consistent balanced meals due to decreased appetite related to Semaglutide -Weight Management. - Patient declined pharmacological adjustment of Semaglutide -Weight Management dose. Counseled on  medication adherence/adverse effects. - Patient states she is feeling improved and back to baseline with no limitations. She can return to work.  - Follow-up with primary provider as scheduled.  Patient was given the opportunity to ask questions.  Patient verbalized understanding of the plan and was able to repeat key elements of the plan. Patient was given clear instructions to go to Emergency Department or return to medical center if symptoms don't improve, worsen, or new problems develop.The patient verbalized understanding.   Return for Follow-Up or next available with Sydney Abo, MD.  Senaida Dama, NP

## 2024-02-18 NOTE — Progress Notes (Signed)
 Patient passed out at work the other day.

## 2024-02-25 ENCOUNTER — Ambulatory Visit: Payer: Self-pay | Admitting: Family Medicine

## 2024-03-05 ENCOUNTER — Other Ambulatory Visit: Payer: Self-pay | Admitting: Family Medicine

## 2024-03-11 ENCOUNTER — Other Ambulatory Visit: Payer: Self-pay | Admitting: Family Medicine

## 2024-03-11 NOTE — Telephone Encounter (Signed)
 Prior auth needed

## 2024-03-11 NOTE — Telephone Encounter (Signed)
 Copied from CRM [id number]. Topic: Clinical - Medication Refill >> Mar 11, 2024 10:36 AM Clyde Darling P wrote: Medication: Semaglutide -Weight Management (WEGOVY ) 1 MG/0.5ML SOAJ   Has the patient contacted their pharmacy? Yes- PCP must call to do preauthorization (Agent: If no, request that the patient contact the pharmacy for the refill. If patient does not wish to contact the pharmacy document the reason why and proceed with request.) (Agent: If yes, when and what did the pharmacy advise?)  This is the patient's preferred pharmacy:  [id number] - [place], [place] - [address] [place] [address] Phone: [phone] Fax: [phone]  Is this the correct pharmacy for this prescription? Yes If no, delete pharmacy and type the correct one.   Has the prescription been filled recently? No  Is the patient out of the medication? Yes  Has the patient been seen for an appointment in the last year OR does the patient have an upcoming appointment? Yes  Can we respond through MyChart? Yes  Agent: Please be advised that Rx refills may take up to 3 business days. We ask that you follow-up with your pharmacy.

## 2024-04-03 ENCOUNTER — Other Ambulatory Visit: Payer: Self-pay | Admitting: Family Medicine

## 2024-04-03 ENCOUNTER — Telehealth: Payer: Self-pay

## 2024-04-03 NOTE — Telephone Encounter (Signed)
 Copied from CRM [id number]. Topic: Clinical - Medication Prior Auth >> Apr 03, 2024 11:26 AM Ivette P wrote: Reason for CRM: pt called in about medication Semaglutide -Weight Management (WEGOVY ) 1 MG/0.5ML SOAJ,  and pt was told unable to pick up due to needing a PA. Pt would like the PA for medication and also to be updated if approved or denied.

## 2024-04-06 ENCOUNTER — Other Ambulatory Visit: Payer: Self-pay

## 2024-04-06 NOTE — Telephone Encounter (Unsigned)
 Copied from CRM [id number]. Topic: Clinical - Medication Prior Auth >> Apr 06, 2024 12:56 PM Tiffini S wrote: Reason for CRM: patient called on 04/03/24  about medication Semaglutide -Weight Management (WEGOVY ) 1 MG/0.5ML. Pharmacy said they would send a request for prior authorization to the office.  Patient  would like a call back at [phone] for updated if approved or denied. Have be waiting for a month.

## 2024-04-06 NOTE — Telephone Encounter (Signed)
Can you help me with this?

## 2024-04-07 NOTE — Telephone Encounter (Signed)
 Patient scheduled.

## 2024-04-08 ENCOUNTER — Ambulatory Visit (INDEPENDENT_AMBULATORY_CARE_PROVIDER_SITE_OTHER): Admitting: Family

## 2024-04-08 ENCOUNTER — Encounter: Payer: Self-pay | Admitting: Family

## 2024-04-08 VITALS — BP 128/87 | HR 86 | Temp 98.6°F | Resp 16 | Ht 61.0 in | Wt 192.2 lb

## 2024-04-08 DIAGNOSIS — R635 Abnormal weight gain: Secondary | ICD-10-CM

## 2024-04-08 DIAGNOSIS — Z7689 Persons encountering health services in other specified circumstances: Secondary | ICD-10-CM

## 2024-04-08 DIAGNOSIS — Z6836 Body mass index (BMI) 36.0-36.9, adult: Secondary | ICD-10-CM | POA: Diagnosis not present

## 2024-04-08 DIAGNOSIS — Z7985 Long-term (current) use of injectable non-insulin antidiabetic drugs: Secondary | ICD-10-CM | POA: Diagnosis not present

## 2024-04-08 MED ORDER — SEMAGLUTIDE-WEIGHT MANAGEMENT 1.7 MG/0.75ML ~~LOC~~ SOAJ
1.7000 mg | SUBCUTANEOUS | 0 refills | Status: DC
Start: 2024-04-08 — End: 2024-06-15

## 2024-04-08 NOTE — Progress Notes (Signed)
 Patient ID: Sydney Alexander, female    DOB: 1986-01-28  MRN: [id number]  CC: Weight Check  Subjective: Sydney Alexander is a 38 y.o. female who presents for weight check.  Her concerns today include:  Doing well on Semaglutide -Weight management, no issues/concerns. States she has not taken Semaglutide -Weight Management in several weeks due to needing an appointment for refills. States she has been off of work for several days so eating more than usual. She plans to resume her normal diet soon. She exercises.  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 2 mL 0   phentermine  (ADIPEX-P ) 37.5 MG tablet Take 1 tablet (37.5 mg total) by mouth daily before breakfast. (Patient not taking: Reported on 12/02/2023) 30 tablet 0   Semaglutide -Weight Management (WEGOVY ) 0.25 MG/0.5ML SOAJ Inject 0.25 mg into the skin once a week. (Patient not taking: Reported on 01/20/2024) 2 mL 0   Semaglutide -Weight Management (WEGOVY ) 0.5 MG/0.5ML SOAJ Inject 0.5 mg into the skin once a week. (Patient not taking: Reported on 02/18/2024) 2 mL 2   No current facility-administered medications on file prior to visit.    No Known Allergies  Social History   Socioeconomic History   Marital status: Single    Spouse name: Not on file   Number of children: 3   Years of education: Not on file   Highest education level: Some college, no degree  Occupational History   Not on file  Tobacco Use   Smoking status: Never   Smokeless tobacco: Never  Vaping Use   Vaping status: Never Used  Substance and Sexual  Activity   Alcohol use: No   Drug use: No   Sexual activity: Yes    Birth control/protection: Surgical    Comment: tubal  Other Topics Concern   Not on file  Social History Narrative   Not on file   Social Drivers of Health   Financial Resource Strain: Medium Risk (11/04/2023)   Overall Financial Resource Strain (CARDIA)    Difficulty of Paying Living Expenses: Somewhat hard  Food Insecurity: No Food Insecurity (11/04/2023)   Hunger Vital Sign    Worried About Running Out of Food in the Last Year: Never true    Ran Out of Food in the Last Year: Never true  Transportation Needs: No Transportation Needs (11/04/2023)   PRAPARE - Administrator, Civil Service (Medical): No    Lack of Transportation (Non-Medical): No  Physical Activity: Insufficiently Active (11/04/2023)   Exercise Vital Sign    Days of Exercise per Week: 2 days    Minutes of Exercise per Session: 30 min  Stress: No Stress Concern Present (11/04/2023)   [employer] of Occupational Health - Occupational Stress Questionnaire    Feeling of Stress : Not at all  Social Connections: Socially Isolated (11/04/2023)   Social Connection and Isolation Panel    Frequency of Communication with Friends and Family: Once a week    Frequency of Social Gatherings with Friends and Family: Never    Attends Religious Services: More than 4 times per year  Active Member of Clubs or Organizations: No    Attends Banker Meetings: Not on file    Marital Status: Never married  Intimate Partner Violence: Not At Risk (04/08/2024)   Humiliation, Afraid, Rape, and Kick questionnaire    Fear of Current or Ex-Partner: No    Emotionally Abused: No    Physically Abused: No    Sexually Abused: No    Family History  Problem Relation Age of Onset   Cancer Maternal Grandmother    Thyroid  disease Mother    Cancer Paternal Aunt    Diabetes Other        lots of aunts and uncles on both sides    Past Surgical  History:  Procedure Laterality Date   LAPAROSCOPIC TUBAL LIGATION Bilateral 12/19/2016   Procedure: LAPAROSCOPIC BILATERAL TUBAL LIGATION USING ELECTROCAUTERY;  Surgeon: Wendelyn Halter, MD;  Location: [facility];  Service: Gynecology;  Laterality: Bilateral;   NO PAST SURGERIES      ROS: Review of Systems Negative except as stated above  PHYSICAL EXAM: BP 128/87   Pulse 86   Temp 98.6 F (37 C) (Oral)   Resp 16   Ht 5' 1 (1.549 m)   Wt 192 lb 3.2 oz (87.2 kg)   LMP 04/01/2024 (Approximate)   SpO2 97%   BMI 36.32 kg/m   Wt Readings from Last 3 Encounters:  04/08/24 192 lb 3.2 oz (87.2 kg)  02/18/24 187 lb (84.8 kg)  01/29/24 192 lb 12.8 oz (87.5 kg)   Physical Exam HENT:     Head: Normocephalic and atraumatic.     Nose: Nose normal.     Mouth/Throat:     Mouth: Mucous membranes are moist.     Pharynx: Oropharynx is clear.   Eyes:     Extraocular Movements: Extraocular movements intact.     Conjunctiva/sclera: Conjunctivae normal.     Pupils: Pupils are equal, round, and reactive to light.    Cardiovascular:     Rate and Rhythm: Normal rate and regular rhythm.     Pulses: Normal pulses.     Heart sounds: Normal heart sounds.  Pulmonary:     Effort: Pulmonary effort is normal.     Breath sounds: Normal breath sounds.   Musculoskeletal:        General: Normal range of motion.     Cervical back: Normal range of motion and neck supple.   Neurological:     General: No focal deficit present.     Mental Status: She is alert and oriented to person, place, and time.   Psychiatric:        Mood and Affect: Mood normal.        Behavior: Behavior normal.     ASSESSMENT AND PLAN: 1. Encounter for weight management (Primary) 2. BMI 36.0-36.9,adult - Patient gained 5 pounds since previous office visit.  - Increase Semaglutide -Weight Management from 1 mg to 1.7 mg as prescribed. Counseled on medication adherence/adverse effects.  - Follow-up with primary provider in 4  weeks or sooner if needed. - Semaglutide -Weight Management 1.7 MG/0.75ML SOAJ; Inject 1.7 mg into the skin once a week.  Dispense: 3 mL; Refill: 0   Patient was given the opportunity to ask questions.  Patient verbalized understanding of the plan and was able to repeat key elements of the plan. Patient was given clear instructions to go to Emergency Department or return to medical center if symptoms don't improve, worsen, or new problems develop.The patient verbalized understanding.  Requested Prescriptions   Signed Prescriptions Disp Refills   Semaglutide -Weight Management 1.7 MG/0.75ML SOAJ 3 mL 0    Sig: Inject 1.7 mg into the skin once a week.    Return for Follow-Up or next available with Abraham Abo, MD.  Senaida Dama, NP

## 2024-04-21 ENCOUNTER — Ambulatory Visit: Admitting: Family Medicine

## 2024-05-11 ENCOUNTER — Ambulatory Visit (INDEPENDENT_AMBULATORY_CARE_PROVIDER_SITE_OTHER): Admitting: Family Medicine

## 2024-05-11 ENCOUNTER — Encounter: Payer: Self-pay | Admitting: Family Medicine

## 2024-05-11 VITALS — BP 124/81 | HR 81 | Ht 61.0 in | Wt 180.6 lb

## 2024-05-11 DIAGNOSIS — Z6834 Body mass index (BMI) 34.0-34.9, adult: Secondary | ICD-10-CM | POA: Diagnosis not present

## 2024-05-11 DIAGNOSIS — Z7689 Persons encountering health services in other specified circumstances: Secondary | ICD-10-CM

## 2024-05-11 DIAGNOSIS — E66811 Obesity, class 1: Secondary | ICD-10-CM | POA: Diagnosis not present

## 2024-05-11 DIAGNOSIS — E6609 Other obesity due to excess calories: Secondary | ICD-10-CM | POA: Diagnosis not present

## 2024-05-11 DIAGNOSIS — Z7985 Long-term (current) use of injectable non-insulin antidiabetic drugs: Secondary | ICD-10-CM

## 2024-05-11 MED ORDER — SEMAGLUTIDE-WEIGHT MANAGEMENT 2.4 MG/0.75ML ~~LOC~~ SOAJ: 2.4000 mg | SUBCUTANEOUS | 0 refills | Status: AC

## 2024-05-11 NOTE — Progress Notes (Signed)
 Established Patient Office Visit  Subjective    Patient ID: Sydney Alexander, female    DOB: 1985/12/05  Age: 38 y.o. MRN: [id number]  CC:  Chief Complaint  Patient presents with   Medical Management of Chronic Issues    HPI [name] presents for routine weight loss. Patient reports no acute complaints.   Outpatient Encounter Medications as of 05/11/2024  Medication Sig   Semaglutide -Weight Management 2.4 MG/0.75ML SOAJ Inject 2.4 mg into the skin once a week.   [DISCONTINUED] Semaglutide -Weight Management (WEGOVY ) 1 MG/0.5ML SOAJ Inject 1 mg into the skin once a week.   Semaglutide -Weight Management 1.7 MG/0.75ML SOAJ Inject 1.7 mg into the skin once a week.   [DISCONTINUED] phentermine  (ADIPEX-P ) 37.5 MG tablet Take 1 tablet (37.5 mg total) by mouth daily before breakfast. (Patient not taking: Reported on 12/02/2023)   [DISCONTINUED] Semaglutide -Weight Management (WEGOVY ) 0.25 MG/0.5ML SOAJ Inject 0.25 mg into the skin once a week. (Patient not taking: Reported on 01/20/2024)   [DISCONTINUED] Semaglutide -Weight Management (WEGOVY ) 0.5 MG/0.5ML SOAJ Inject 0.5 mg into the skin once a week. (Patient not taking: Reported on 02/18/2024)   No facility-administered encounter medications on file as of 05/11/2024.    Past Medical History:  Diagnosis Date   Abnormal pap    BV (bacterial vaginosis)    Contraceptive management 03/10/2015   Cramping affecting pregnancy, antepartum 03/26/2016   Encounter for IUD removal 03/10/2015   Vaginal discharge 03/26/2016   Vaginal irritation 03/26/2016   Vaginal Pap smear, abnormal     Past Surgical History:  Procedure Laterality Date   LAPAROSCOPIC TUBAL LIGATION Bilateral 12/19/2016   Procedure: LAPAROSCOPIC BILATERAL TUBAL LIGATION USING ELECTROCAUTERY;  Surgeon: Vonn VEAR Inch, MD;  Location: [facility];  Service: Gynecology;  Laterality: Bilateral;   NO PAST SURGERIES      Family History  Problem Relation Age of Onset   Cancer Maternal Grandmother     Thyroid  disease Mother    Cancer Paternal Aunt    Diabetes Other        lots of aunts and uncles on both sides    Social History   Socioeconomic History   Marital status: Single    Spouse name: Not on file   Number of children: 3   Years of education: Not on file   Highest education level: Some college, no degree  Occupational History   Not on file  Tobacco Use   Smoking status: Never   Smokeless tobacco: Never  Vaping Use   Vaping status: Never Used  Substance and Sexual Activity   Alcohol use: No   Drug use: No   Sexual activity: Yes    Birth control/protection: Surgical    Comment: tubal  Other Topics Concern   Not on file  Social History Narrative   Not on file   Social Drivers of Health   Financial Resource Strain: Medium Risk (11/04/2023)   Overall Financial Resource Strain (CARDIA)    Difficulty of Paying Living Expenses: Somewhat hard  Food Insecurity: No Food Insecurity (11/04/2023)   Hunger Vital Sign    Worried About Running Out of Food in the Last Year: Never true    Ran Out of Food in the Last Year: Never true  Transportation Needs: No Transportation Needs (11/04/2023)   PRAPARE - Administrator, Civil Service (Medical): No    Lack of Transportation (Non-Medical): No  Physical Activity: Insufficiently Active (11/04/2023)   Exercise Vital Sign    Days of Exercise per  Week: 2 days    Minutes of Exercise per Session: 30 min  Stress: No Stress Concern Present (11/04/2023)   [employer] of Occupational Health - Occupational Stress Questionnaire    Feeling of Stress : Not at all  Social Connections: Socially Isolated (11/04/2023)   Social Connection and Isolation Panel    Frequency of Communication with Friends and Family: Once a week    Frequency of Social Gatherings with Friends and Family: Never    Attends Religious Services: More than 4 times per year    Active Member of [employer] or Organizations: No    Attends Hospital doctor: Not on file    Marital Status: Never married  Intimate Partner Violence: Not At Risk (04/08/2024)   Humiliation, Afraid, Rape, and Kick questionnaire    Fear of Current or Ex-Partner: No    Emotionally Abused: No    Physically Abused: No    Sexually Abused: No    Review of Systems  All other systems reviewed and are negative.       Objective    BP 124/81   Pulse 81   Ht 5' 1 (1.549 m)   Wt 180 lb 9.6 oz (81.9 kg)   SpO2 97%   BMI 34.12 kg/m   Physical Exam Vitals and nursing note reviewed.  Constitutional:      General: She is not in acute distress.    Appearance: She is obese.  Cardiovascular:     Rate and Rhythm: Normal rate and regular rhythm.  Pulmonary:     Effort: Pulmonary effort is normal.     Breath sounds: Normal breath sounds.  Abdominal:     Palpations: Abdomen is soft.     Tenderness: There is no abdominal tenderness.  Neurological:     General: No focal deficit present.     Mental Status: She is alert and oriented to person, place, and time.         Assessment & Plan:   Encounter for weight management  Class 1 obesity due to excess calories without serious comorbidity with body mass index (BMI) of 34.0 to 34.9 in adult  Other orders -     Semaglutide -Weight Management; Inject 2.4 mg into the skin once a week.  Dispense: 3 mL; Refill: 0   Doing well. Will increase dose. Continue   Return in about 4 weeks (around 06/08/2024) for follow up, weight management.   Tanda Raguel SQUIBB, MD

## 2024-06-15 ENCOUNTER — Encounter: Payer: Self-pay | Admitting: Family Medicine

## 2024-06-15 ENCOUNTER — Ambulatory Visit: Admitting: Family Medicine

## 2024-06-15 VITALS — BP 109/73 | HR 75 | Ht 61.0 in | Wt 180.2 lb

## 2024-06-15 DIAGNOSIS — Z6834 Body mass index (BMI) 34.0-34.9, adult: Secondary | ICD-10-CM | POA: Diagnosis not present

## 2024-06-15 DIAGNOSIS — E6609 Other obesity due to excess calories: Secondary | ICD-10-CM | POA: Diagnosis not present

## 2024-06-15 DIAGNOSIS — E66811 Obesity, class 1: Secondary | ICD-10-CM | POA: Diagnosis not present

## 2024-06-15 DIAGNOSIS — Z7689 Persons encountering health services in other specified circumstances: Secondary | ICD-10-CM

## 2024-06-15 MED ORDER — SEMAGLUTIDE-WEIGHT MANAGEMENT 1.7 MG/0.75ML ~~LOC~~ SOAJ: 1.7000 mg | SUBCUTANEOUS | 0 refills | Status: AC

## 2024-06-15 NOTE — Progress Notes (Signed)
 Established Patient Office Visit  Subjective    Patient ID: Sydney Alexander, female    DOB: Jan 09, 1986  Age: 38 y.o. MRN: [id number]  CC:  Chief Complaint  Patient presents with   Medical Management of Chronic Issues    Pt reports wegovy  dosage is too strong and she would like to go back down.     HPI Sydney Alexander presents for routine weight management. Patient reports that she she seems to have had more side effects at this higher dosage and would like to reduce the dosage of the med.   Outpatient Encounter Medications as of 06/15/2024  Medication Sig   Semaglutide -Weight Management 2.4 MG/0.75ML SOAJ Inject 2.4 mg into the skin once a week.   semaglutide -weight management (WEGOVY ) 1.7 MG/0.75ML SOAJ SQ injection Inject 1.7 mg into the skin once a week.   [DISCONTINUED] Semaglutide -Weight Management 1.7 MG/0.75ML SOAJ Inject 1.7 mg into the skin once a week.   No facility-administered encounter medications on file as of 06/15/2024.    Past Medical History:  Diagnosis Date   Abnormal pap    BV (bacterial vaginosis)    Contraceptive management 03/10/2015   Cramping affecting pregnancy, antepartum 03/26/2016   Encounter for IUD removal 03/10/2015   Vaginal discharge 03/26/2016   Vaginal irritation 03/26/2016   Vaginal Pap smear, abnormal     Past Surgical History:  Procedure Laterality Date   LAPAROSCOPIC TUBAL LIGATION Bilateral 12/19/2016   Procedure: LAPAROSCOPIC BILATERAL TUBAL LIGATION USING ELECTROCAUTERY;  Surgeon: Vonn VEAR Inch, MD;  Location: [facility];  Service: Gynecology;  Laterality: Bilateral;   NO PAST SURGERIES      Family History  Problem Relation Age of Onset   Cancer Maternal Grandmother    Thyroid  disease Mother    Cancer Paternal Aunt    Diabetes Other        lots of aunts and uncles on both sides    Social History   Socioeconomic History   Marital status: Single    Spouse name: Not on file   Number of children: 3   Years of education: Not on file    Highest education level: Some college, no degree  Occupational History   Not on file  Tobacco Use   Smoking status: Never   Smokeless tobacco: Never  Vaping Use   Vaping status: Never Used  Substance and Sexual Activity   Alcohol use: No   Drug use: No   Sexual activity: Yes    Birth control/protection: Surgical    Comment: tubal  Other Topics Concern   Not on file  Social History Narrative   Not on file   Social Drivers of Health   Financial Resource Strain: Medium Risk (11/04/2023)   Overall Financial Resource Strain (CARDIA)    Difficulty of Paying Living Expenses: Somewhat hard  Food Insecurity: No Food Insecurity (11/04/2023)   Hunger Vital Sign    Worried About Running Out of Food in the Last Year: Never true    Ran Out of Food in the Last Year: Never true  Transportation Needs: No Transportation Needs (11/04/2023)   PRAPARE - Administrator, Civil Service (Medical): No    Lack of Transportation (Non-Medical): No  Physical Activity: Insufficiently Active (11/04/2023)   Exercise Vital Sign    Days of Exercise per Week: 2 days    Minutes of Exercise per Session: 30 min  Stress: No Stress Concern Present (11/04/2023)   [employer] of Occupational Health - Occupational Stress Questionnaire  Feeling of Stress : Not at all  Social Connections: Socially Isolated (11/04/2023)   Social Connection and Isolation Panel    Frequency of Communication with Friends and Family: Once a week    Frequency of Social Gatherings with Friends and Family: Never    Attends Religious Services: More than 4 times per year    Active Member of [employer] or Organizations: No    Attends Engineer, structural: Not on file    Marital Status: Never married  Intimate Partner Violence: Not At Risk (04/08/2024)   Humiliation, Afraid, Rape, and Kick questionnaire    Fear of Current or Ex-Partner: No    Emotionally Abused: No    Physically Abused: No    Sexually Abused: No     Review of Systems  All other systems reviewed and are negative.       Objective    BP 109/73   Pulse 75   Ht 5' 1 (1.549 m)   Wt 180 lb 3.2 oz (81.7 kg)   LMP 06/01/2024 (Approximate)   SpO2 99%   BMI 34.05 kg/m   Physical Exam Vitals and nursing note reviewed.  Constitutional:      General: She is not in acute distress.    Appearance: She is obese.  Cardiovascular:     Rate and Rhythm: Normal rate and regular rhythm.  Pulmonary:     Effort: Pulmonary effort is normal.     Breath sounds: Normal breath sounds.  Abdominal:     Palpations: Abdomen is soft.     Tenderness: There is no abdominal tenderness.  Neurological:     General: No focal deficit present.     Mental Status: She is alert and oriented to person, place, and time.         Assessment & Plan:   Encounter for weight management -     Semaglutide -Weight Management; Inject 1.7 mg into the skin once a week.  Dispense: 3 mL; Refill: 0  Class 1 obesity due to excess calories without serious comorbidity with body mass index (BMI) of 34.0 to 34.9 in adult     Return in about 4 weeks (around 07/13/2024) for follow up, weight management.   Tanda Raguel SQUIBB, MD

## 2024-07-20 ENCOUNTER — Telehealth: Payer: Self-pay

## 2024-07-20 NOTE — Telephone Encounter (Signed)
 Pharmacy Patient Advocate Encounter  Received notification from [facility] that Prior Authorization for WEGOVY  has been APPROVED from 07/20/2024 to 07/20/2025   PA #/Case ID/Reference #: [id number]

## 2024-07-28 ENCOUNTER — Other Ambulatory Visit (HOSPITAL_COMMUNITY): Payer: Self-pay

## 2024-07-29 ENCOUNTER — Ambulatory Visit: Admitting: Family Medicine

## 2024-09-21 ENCOUNTER — Ambulatory Visit: Admitting: Family Medicine
